# Patient Record
Sex: Female | Born: 1985 | Hispanic: Yes | Marital: Single | Smoking: Never smoker
Health system: Southern US, Community
[De-identification: ages and names within clinical notes are randomized; demographics above are authoritative.]

## PROBLEM LIST (undated history)

## (undated) DIAGNOSIS — Z973 Presence of spectacles and contact lenses: Secondary | ICD-10-CM

## (undated) DIAGNOSIS — B009 Herpesviral infection, unspecified: Secondary | ICD-10-CM

## (undated) DIAGNOSIS — Z8619 Personal history of other infectious and parasitic diseases: Secondary | ICD-10-CM

## (undated) DIAGNOSIS — K2970 Gastritis, unspecified, without bleeding: Secondary | ICD-10-CM

## (undated) DIAGNOSIS — I10 Essential (primary) hypertension: Secondary | ICD-10-CM

## (undated) HISTORY — DX: Herpesviral infection, unspecified: B00.9

## (undated) HISTORY — DX: Personal history of other infectious and parasitic diseases: Z86.19

## (undated) HISTORY — DX: Gastritis, unspecified, without bleeding: K29.70

## (undated) HISTORY — DX: Essential (primary) hypertension: I10

---
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
## 2006-10-30 ENCOUNTER — Ambulatory Visit: Payer: Self-pay | Admitting: Internal Medicine

## 2007-02-26 ENCOUNTER — Ambulatory Visit: Payer: Self-pay | Admitting: Radiology

## 2007-02-26 ENCOUNTER — Ambulatory Visit: Payer: Self-pay | Admitting: Urology

## 2008-12-11 ENCOUNTER — Emergency Department: Payer: Self-pay | Admitting: Internal Medicine

## 2009-12-22 HISTORY — PX: ESOPHAGOGASTRODUODENOSCOPY: SHX1529

## 2010-01-03 ENCOUNTER — Ambulatory Visit: Payer: Self-pay | Admitting: Gastroenterology

## 2010-01-21 ENCOUNTER — Ambulatory Visit: Payer: Self-pay | Admitting: Family Medicine

## 2010-06-27 IMAGING — CR RIGHT LITTLE FINGER 2+V
1 series · 3 of 3 positions shown · non-contrast
Comparison: none

REASON FOR EXAM: injury  swelling
COMMENTS:

PROCEDURE:     DXR - DXR FINGER PINKY 5TH DIGIT RT HA  - December 11, 2008  [DATE]
RESULT:     Images of the right fifth digit demonstrate no definite
fracture, dislocation or radiopaque foreign body.

[Series 1: view not recorded · 0.17mm/px · 3 of 3 slices shown]
[im 1/3]
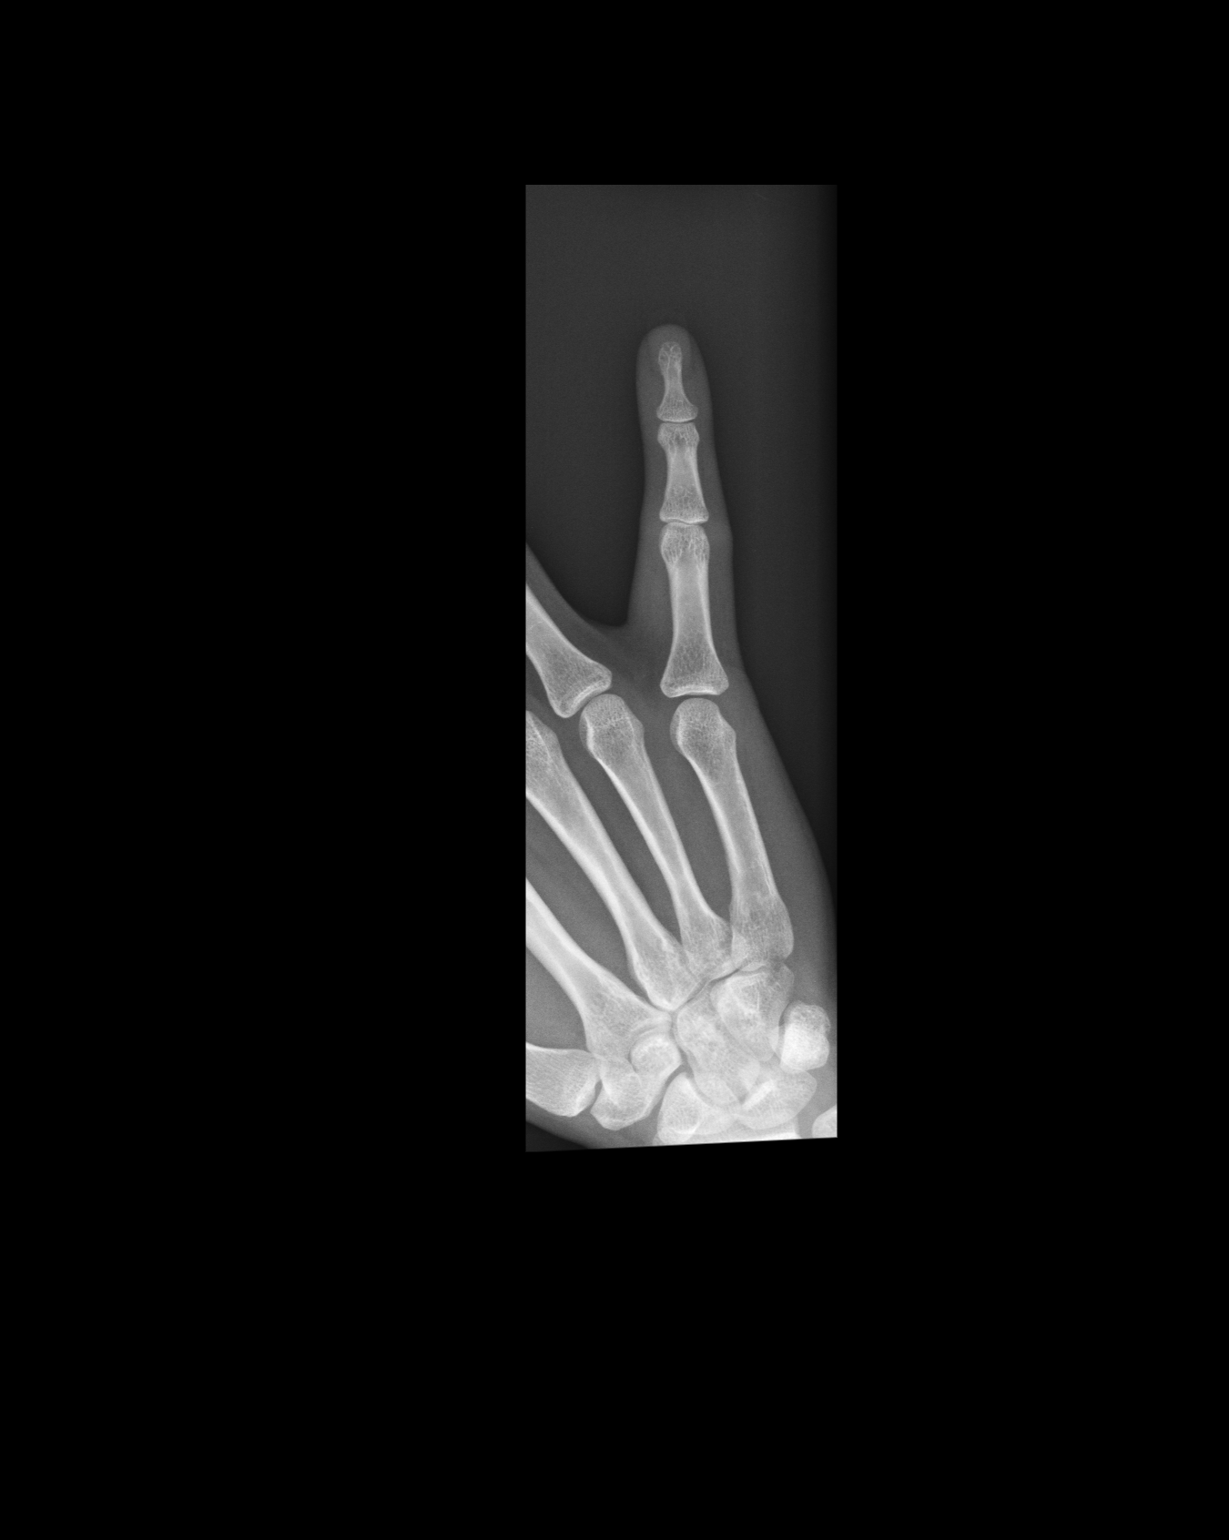
[im 2/3]
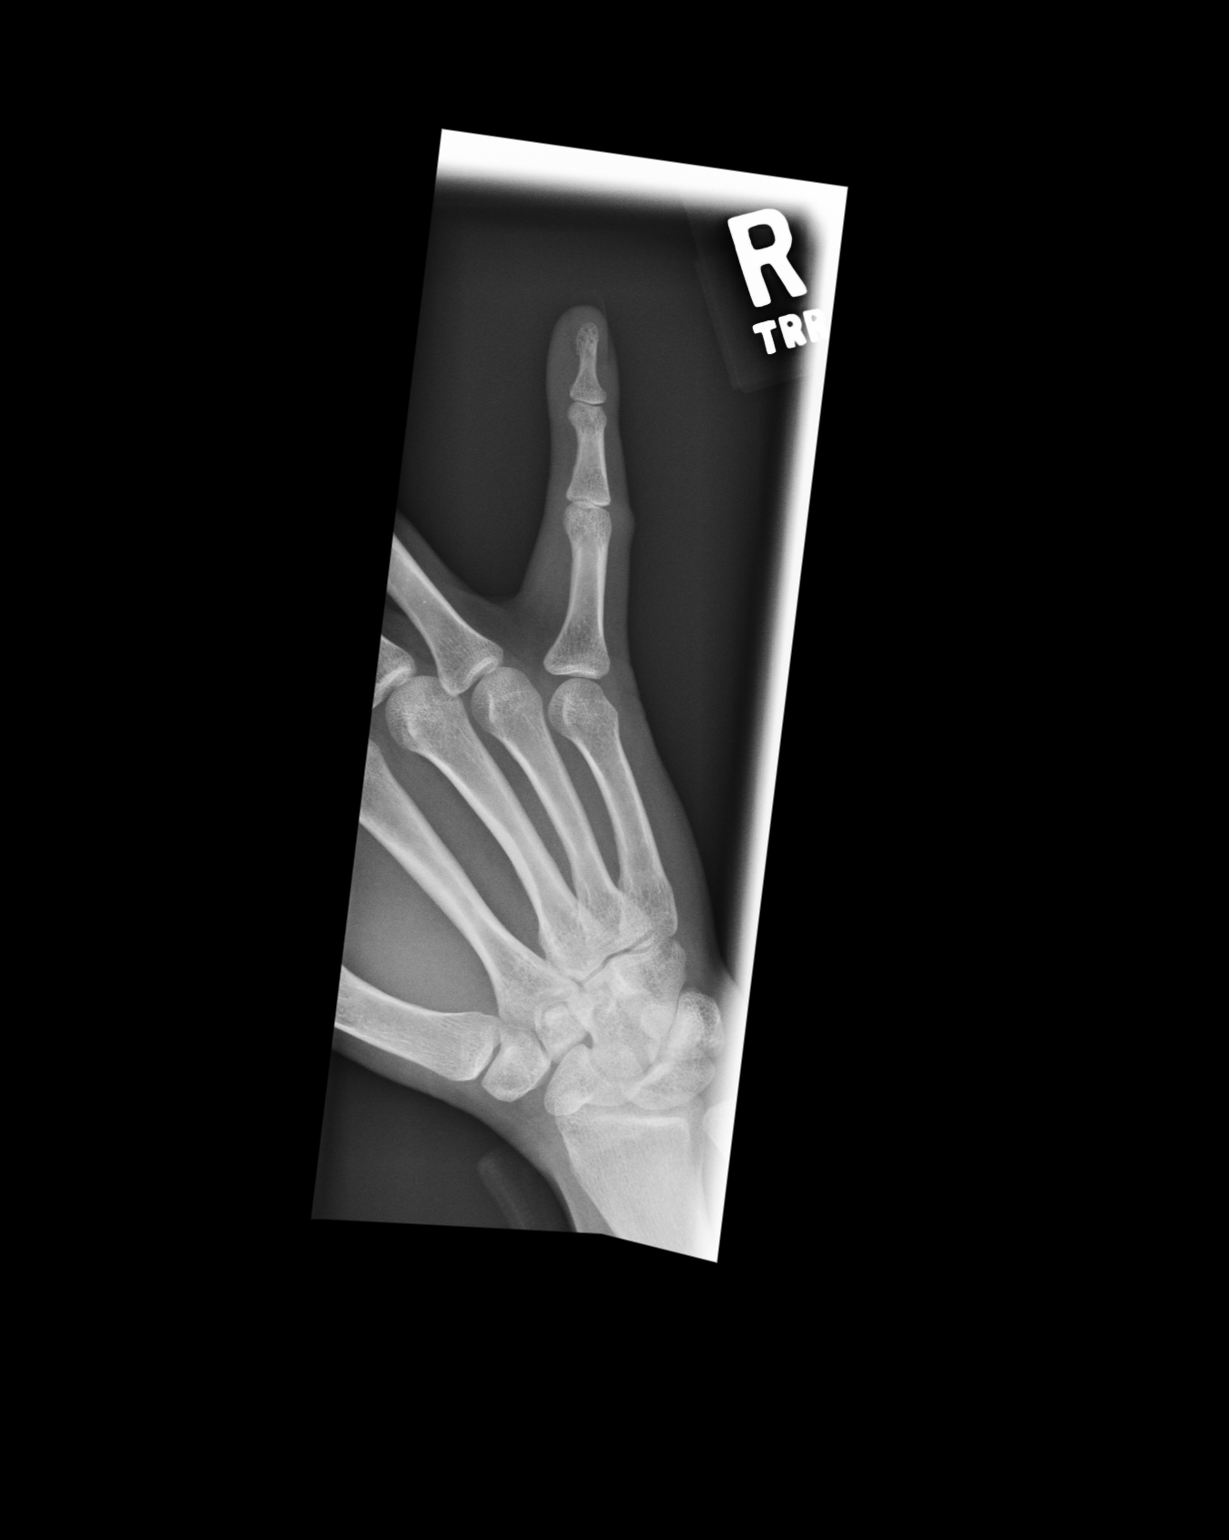
[im 3/3]
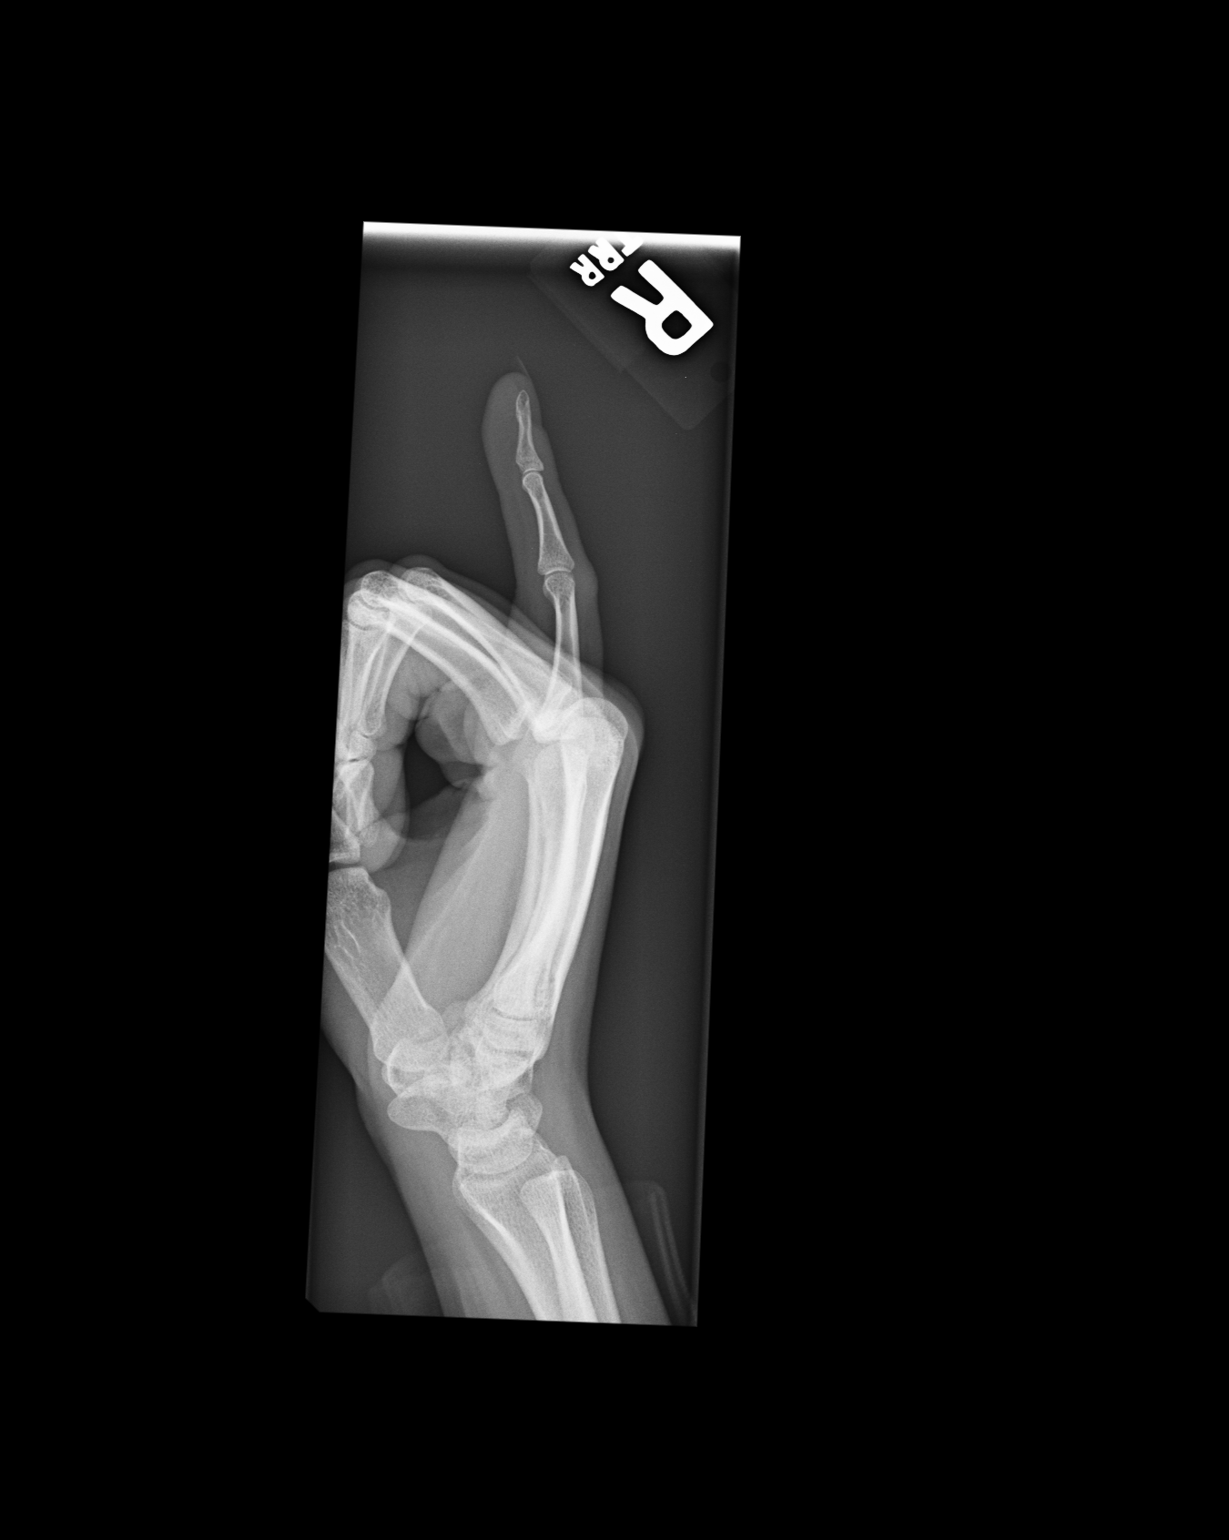

[3 of 3 positions shown; findings below may reference images not displayed]

IMPRESSION: Please see above.

## 2011-05-02 ENCOUNTER — Observation Stay: Payer: Self-pay | Admitting: Obstetrics and Gynecology

## 2011-05-12 ENCOUNTER — Observation Stay: Payer: Self-pay | Admitting: Internal Medicine

## 2011-05-16 ENCOUNTER — Observation Stay: Payer: Self-pay | Admitting: Obstetrics & Gynecology

## 2011-05-19 ENCOUNTER — Observation Stay: Payer: Self-pay | Admitting: Internal Medicine

## 2011-05-21 ENCOUNTER — Observation Stay: Payer: Self-pay

## 2011-05-24 ENCOUNTER — Observation Stay: Payer: Self-pay

## 2011-05-27 ENCOUNTER — Observation Stay: Payer: Self-pay

## 2011-05-30 ENCOUNTER — Observation Stay: Payer: Self-pay | Admitting: Obstetrics and Gynecology

## 2011-06-08 ENCOUNTER — Inpatient Hospital Stay: Payer: Self-pay

## 2011-08-07 IMAGING — CT CT STONE STUDY
1 of 2 series · 15 of 32 positions shown, 19 images · non-contrast
Comparison: none

REASON FOR EXAM: epigastric abd pain  flank pain  hematuria  eval stones
COMMENTS:

[Series 2: soft tissue · axial · 0.58mm/px · z∈[-890,-524]mm · 15 of 139 slices shown, 19 images]
[im 11/139  soft-tissue]
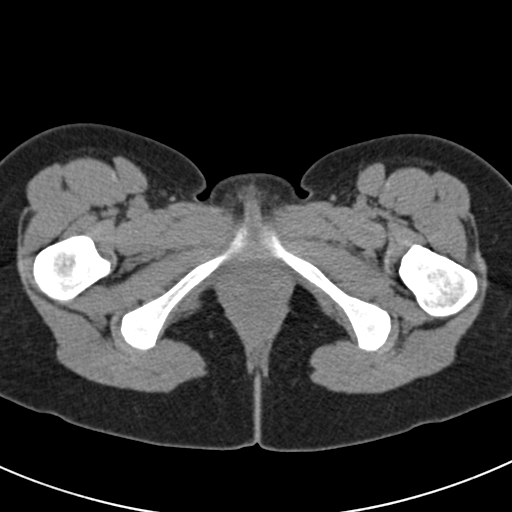
[im 11/139  bone]
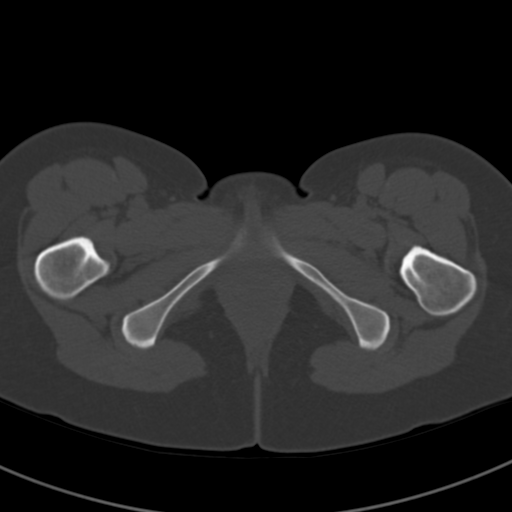
[im 21/139  soft-tissue]
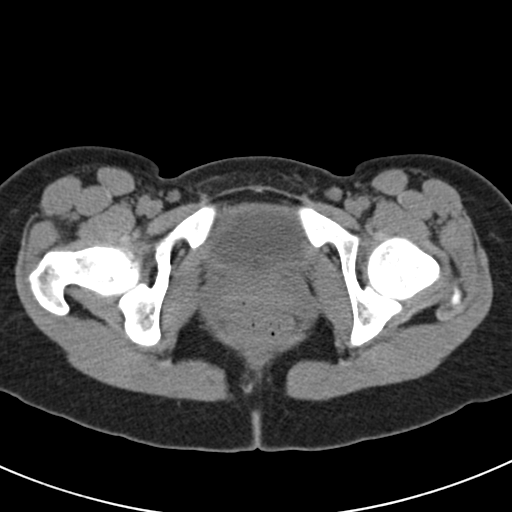
[im 31/139  soft-tissue]
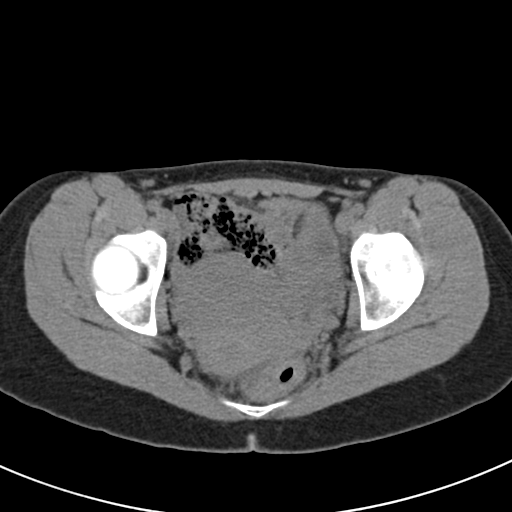
[im 41/139  soft-tissue]
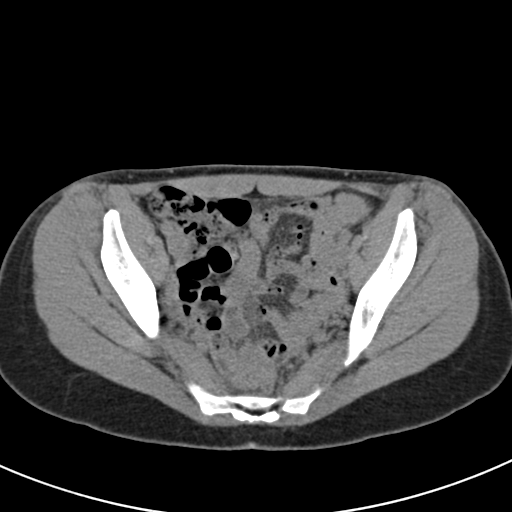
[im 52/139  soft-tissue]
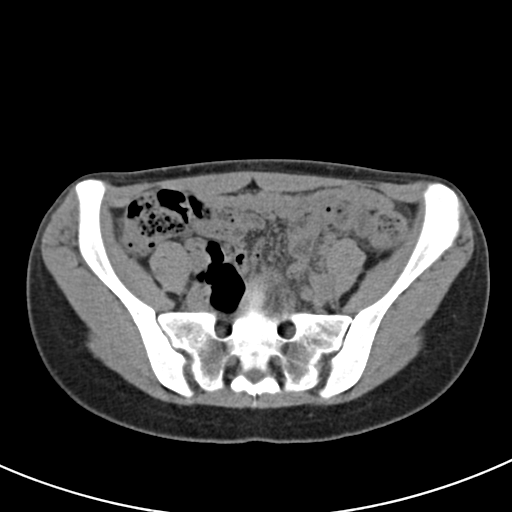
[im 62/139  soft-tissue]
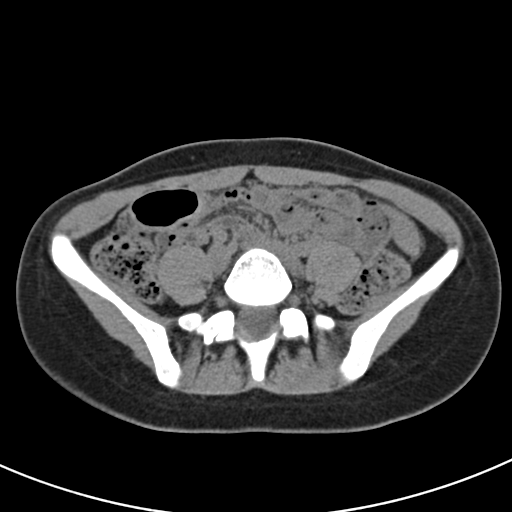
[im 72/139  soft-tissue]
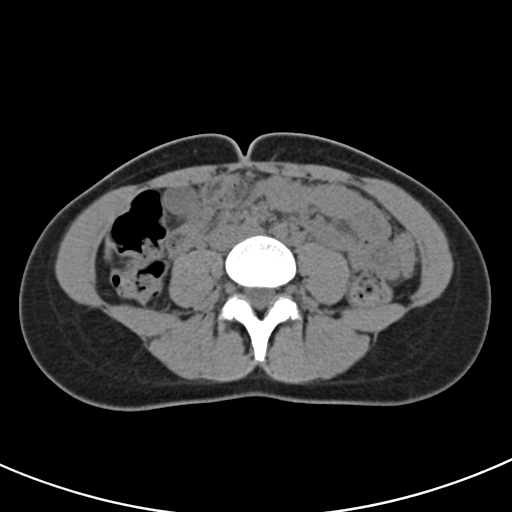
[im 82/139  soft-tissue]
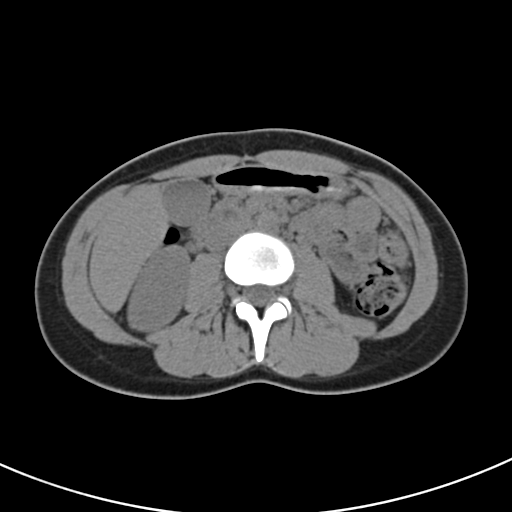
[im 93/139  soft-tissue]
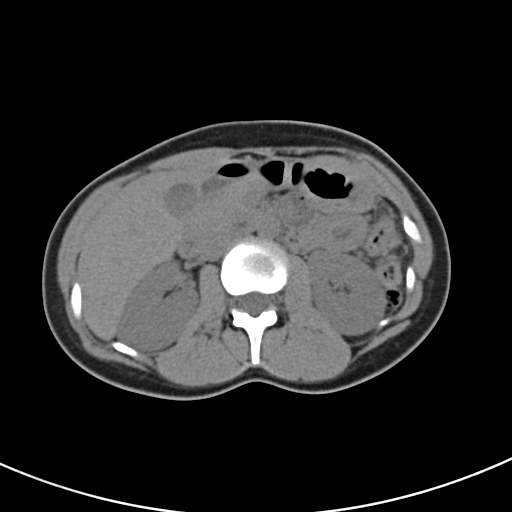
[im 93/139  bone]
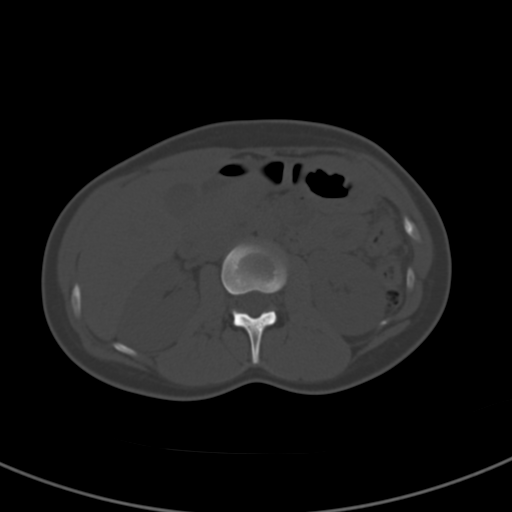
[im 103/139  soft-tissue]
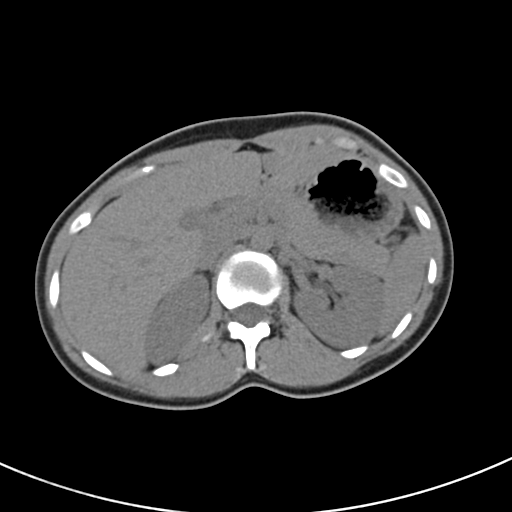
[im 113/139  soft-tissue]
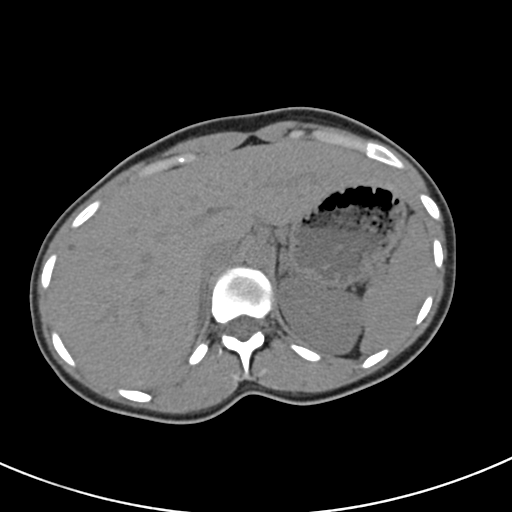
[im 118/139  lung]
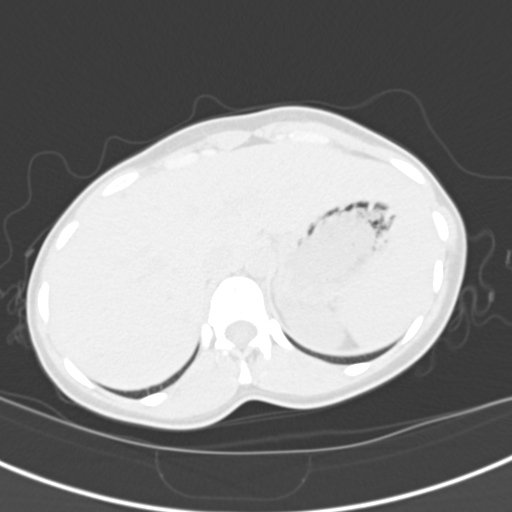
[im 123/139  soft-tissue]
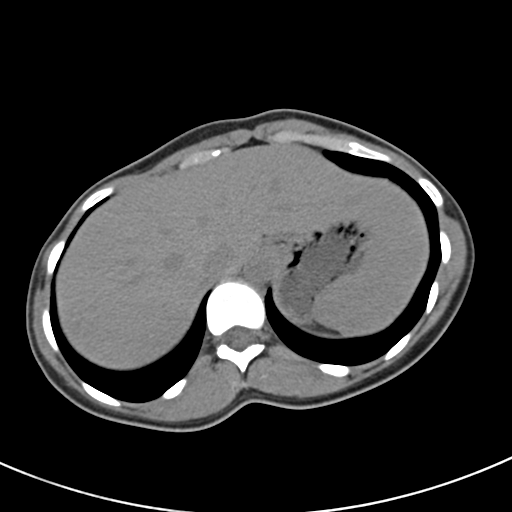
[im 123/139  lung]
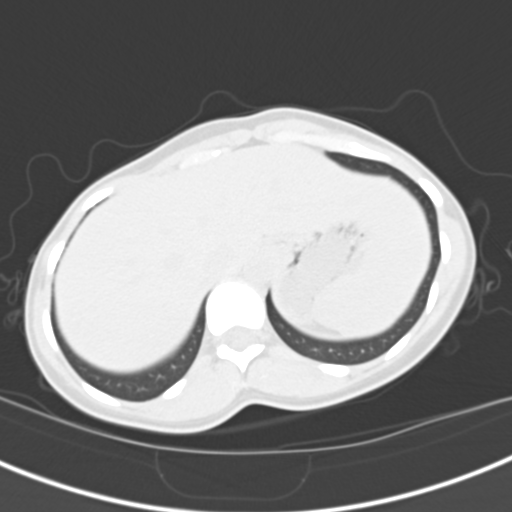
[im 128/139  lung]
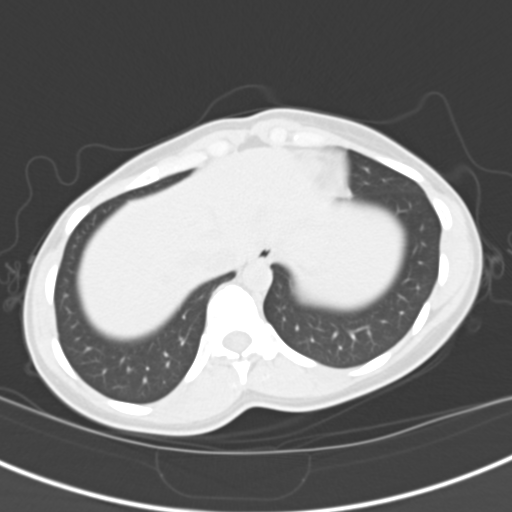
[im 133/139  soft-tissue]
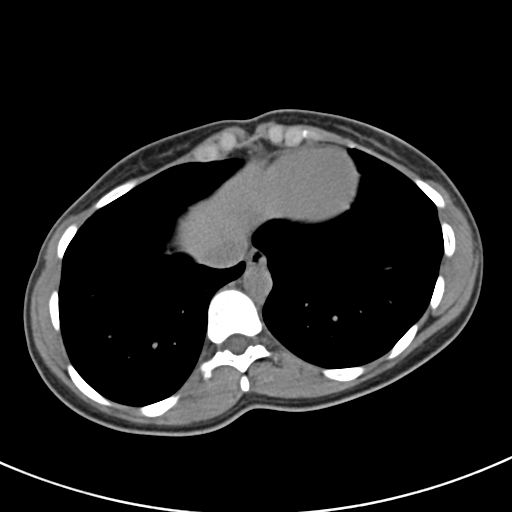
[im 133/139  lung]
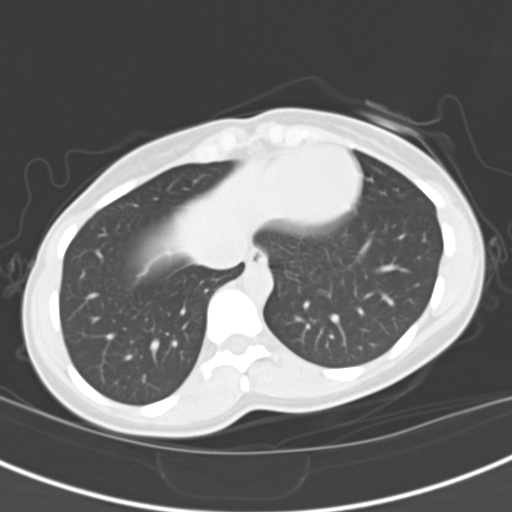

[15 of 32 positions shown; findings below may reference images not displayed]

PROCEDURE:     CT  - CT ABDOMEN /PELVIS WO (STONE)  - January 21, 2010  [DATE]

RESULT:     Axial noncontrast CT scanning was performed through the abdomen
and pelvis at 3 mm intervals and slice thicknesses. Comparison is made to
the study 26 February, 2007.

The kidneys are normal in density and contour. No calcified stones are
identified. The perinephric fat appears normal. Along the expected course of
the ureters I do not see abnormal calcifications. Within the pelvis the
partially distended urinary bladder is normal in appearance. The uterus is
situated to the right of midline. There may be a trace of free fluid in the
pelvis. I do not see suspicious appearing adnexal masses. The unopacified
loops of small and large bowel exhibit no acute abnormality. I do not see
evidence of diverticulosis.

The liver, spleen, partially distended stomach, gallbladder, pancreas, and
adrenal glands are normal in appearance. The caliber of the abdominal aorta
is normal. The lung bases are clear. The lumbar vertebral bodies are
preserved in height. There is gentle curvature of the thoracolumbar spine
with the convexity toward the right.
IMPRESSION: 1. I do not see evidence of calcified urinary tract stones nor other acute
urinary tract abnormality.
2. I do not see evidence of gallstones or other hepatobiliary abnormality.
3. I see no acute bowel abnormality.
4. Within the pelvis there is a small amount of free fluid in the
cul-de-sac. This is not new. I do not see discrete adnexal masses nor acute
uterine pathology.

This study overall is limited due to the lack of oral or intravenous
contrast material. If the patient's symptoms persist and remain unexplained,
contrast enhanced CT scanning of the abdomen and pelvis may be useful. If
the symptoms are referable to the pelvis, a pelvic ultrasound may be a
useful next step.

## 2016-01-02 ENCOUNTER — Ambulatory Visit: Payer: Self-pay | Admitting: Gastroenterology

## 2016-01-03 ENCOUNTER — Encounter: Payer: Self-pay | Admitting: Gastroenterology

## 2016-01-03 ENCOUNTER — Ambulatory Visit (INDEPENDENT_AMBULATORY_CARE_PROVIDER_SITE_OTHER): Payer: BLUE CROSS/BLUE SHIELD | Admitting: Gastroenterology

## 2016-01-03 ENCOUNTER — Ambulatory Visit: Payer: Self-pay | Admitting: Gastroenterology

## 2016-01-03 VITALS — BP 136/72 | HR 80 | Temp 98.9°F | Ht 63.0 in | Wt 148.0 lb

## 2016-01-03 DIAGNOSIS — A048 Other specified bacterial intestinal infections: Secondary | ICD-10-CM

## 2016-01-03 DIAGNOSIS — B9681 Helicobacter pylori [H. pylori] as the cause of diseases classified elsewhere: Secondary | ICD-10-CM

## 2016-01-03 DIAGNOSIS — R109 Unspecified abdominal pain: Secondary | ICD-10-CM

## 2016-01-03 NOTE — Progress Notes (Signed)
Gastroenterology Consultation  Referring Provider:     No ref. provider found Primary Care Physician:  No PCP Per Patient Primary Gastroenterologist:  Dr. Servando Snare     Reason for Consultation:     Left-sided pain.        HPI:   Brooke Koch is a 30 y.o. y/o female referred for consultation & management of left sided pain by Dr. Bonnetta Barry PCP Per Patient.  This patient comes in today after having a history of H. pylori and being seen by  another gastrologist with an upper endoscopy back in 2011. At that time she was found to have gastritis and was treated for H. pylori. The patient denies ever coming back to have her H. pylori occasion checked. The patient states that her abdominal pain went away after that. The pain is not associated with eating or drinking. She also denies any association with moving her bowels. There is no report of any movements making her symptoms worse and the pain does not wake her up from a sleep. The patient has not had any unexplained weight loss or change in bowel habits. The patient reports that the pain is in the left side closer to the back/flank. She is presently not taking any medication for gastritis or dyspepsia. 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  Past Medical History  Diagnosis Date  . History of Helicobacter pylori infection   . Gastritis     Past Surgical History  Procedure Laterality Date  . Esophagogastroduodenoscopy  12/2009    Prior to Admission medications   Not on File    Family History  Problem Relation Age of Onset  . Hypertension Mother   . Hypertension Father   . Diabetes Father   . Glaucoma Mother   . Prostate cancer Father     cancer free x 1 year     Social History  Substance Use Topics  . Smoking status: Never Smoker   . Smokeless tobacco: Never Used  . Alcohol Use: 0.0 oz/week    0 Standard drinks or equivalent per week     Comment: social drinker    Allergies as of  01/03/2016 - Review Complete 01/03/2016  Allergen Reaction Noted  . Septra [sulfamethoxazole-trimethoprim] Hives 01/03/2016    Review of Systems:    All systems reviewed and negative except where noted in HPI.   Physical Exam:  BP 136/72 mmHg  Pulse 80  Temp(Src) 98.9 F (37.2 C) (Oral)  Ht 5\' 3"  (1.6 m)  Wt 148 lb (67.132 kg)  BMI 26.22 kg/m2 No LMP recorded. Psych:  Alert and cooperative. Normal mood and affect. General:   Alert,  Well-developed, well-nourished, pleasant and cooperative in NAD Head:  Normocephalic and atraumatic. Eyes:  Sclera clear, no icterus.   Conjunctiva pink. Ears:  Normal auditory acuity. Nose:  No deformity, discharge, or lesions. Mouth:  No deformity or lesions,oropharynx pink & moist. Neck:  Supple; no masses or thyromegaly. Lungs:  Respirations even and unlabored.  Clear throughout to auscultation.   No wheezes, crackles, or rhonchi. No acute distress. Heart:  Regular rate and rhythm; no murmurs, clicks, rubs, or gallops. Abdomen:  Normal bowel sounds.  No bruits.  Soft, non-tender and non-distended without masses, hepatosplenomegaly or hernias noted.  No guarding or rebound tenderness.  Negative Carnett sign.   Rectal:  Deferred.  Msk:  Symmetrical without gross deformities.  Good, equal movement & strength bilaterally. Pulses:  Normal pulses  noted. Extremities:  No clubbing or edema.  No cyanosis. Neurologic:  Alert and oriented x3;  grossly normal neurologically. Skin:  Intact without significant lesions or rashes.  No jaundice. Lymph Nodes:  No significant cervical adenopathy. Psych:  Alert and cooperative. Normal mood and affect.  Imaging Studies: No results found.  Assessment and Plan:   Brooke Koch is a 30 y.o. y/o female who has a history of H. pylori with eradication ever being checked. The patient has left-sided flank pain that is similar to the pain she had when she was found to have gastritis. The patient will be checked for H.  pylori and will also be started on Dexilant. The symptoms do not appear to be GI related but since she has had this in the past with similar pain and found to have gastritis I will also treat her for the possibility of having gastritis with a PPI. He should has been explained the plan and agrees with it.   Note: This dictation was prepared with Dragon dictation along with smaller phrase technology. Any transcriptional errors that result from this process are unintentional.

## 2016-01-05 LAB — H. PYLORI ANTIBODY, IGA

## 2016-01-08 NOTE — Progress Notes (Signed)
LVM with normal results. Advised to contact me if she has any questions.

## 2016-01-14 ENCOUNTER — Ambulatory Visit: Payer: Self-pay | Admitting: Gastroenterology

## 2016-04-26 ENCOUNTER — Encounter: Payer: Self-pay | Admitting: Gynecology

## 2016-04-26 ENCOUNTER — Ambulatory Visit
Admission: EM | Admit: 2016-04-26 | Discharge: 2016-04-26 | Disposition: A | Discharge status: Home / Self Care | Payer: BLUE CROSS/BLUE SHIELD | Attending: Emergency Medicine | Admitting: Emergency Medicine

## 2016-04-26 DIAGNOSIS — J029 Acute pharyngitis, unspecified: Secondary | ICD-10-CM

## 2016-04-26 LAB — RAPID STREP SCREEN (MED CTR MEBANE ONLY): Streptococcus, Group A Screen (Direct): NEGATIVE

## 2016-04-26 LAB — RAPID INFLUENZA A&B ANTIGENS: Influenza B (ARMC): NEGATIVE

## 2016-04-26 LAB — RAPID INFLUENZA A&B ANTIGENS (ARMC ONLY): INFLUENZA A (ARMC): NEGATIVE

## 2016-04-26 NOTE — Discharge Instructions (Signed)
Pharyngitis Pharyngitis is redness, pain, and swelling (inflammation) of your pharynx.  CAUSES  Pharyngitis is usually caused by infection. Most of the time, these infections are from viruses (viral) and are part of a cold. However, sometimes pharyngitis is caused by bacteria (bacterial). Pharyngitis can also be caused by allergies. Viral pharyngitis may be spread from person to person by coughing, sneezing, and personal items or utensils (cups, forks, spoons, toothbrushes). Bacterial pharyngitis may be spread from person to person by more intimate contact, such as kissing.  SIGNS AND SYMPTOMS  Symptoms of pharyngitis include:   Sore throat.   Tiredness (fatigue).   Low-grade fever.   Headache.  Joint pain and muscle aches.  Skin rashes.  Swollen lymph nodes.  Plaque-like film on throat or tonsils (often seen with bacterial pharyngitis). DIAGNOSIS  Your health care provider will ask you questions about your illness and your symptoms. Your medical history, along with a physical exam, is often all that is needed to diagnose pharyngitis. Sometimes, a rapid strep test is done. Other lab tests may also be done, depending on the suspected cause.  TREATMENT  Viral pharyngitis will usually get better in 3-4 days without the use of medicine. Bacterial pharyngitis is treated with medicines that kill germs (antibiotics).  HOME CARE INSTRUCTIONS   Drink enough water and fluids to keep your urine clear or pale yellow.   Only take over-the-counter or prescription medicines as directed by your health care provider:   If you are prescribed antibiotics, make sure you finish them even if you start to feel better.   Do not take aspirin.   Get lots of rest.   Gargle with 8 oz of salt water ( tsp of salt per 1 qt of water) as often as every 1-2 hours to soothe your throat.   Throat lozenges (if you are not at risk for choking) or sprays may be used to soothe your throat. SEEK MEDICAL  CARE IF:   You have large, tender lumps in your neck.  You have a rash.  You cough up green, yellow-brown, or bloody spit. SEEK IMMEDIATE MEDICAL CARE IF:   Your neck becomes stiff.  You drool or are unable to swallow liquids.  You vomit or are unable to keep medicines or liquids down.  You have severe pain that does not go away with the use of recommended medicines.  You have trouble breathing (not caused by a stuffy nose). MAKE SURE YOU:   Understand these instructions.  Will watch your condition.  Will get help right away if you are not doing well or get worse.   This information is not intended to replace advice given to you by your health care provider. Make sure you discuss any questions you have with your health care provider.   Document Released: 12/08/2005 Document Revised: 09/28/2013 Document Reviewed: 08/15/2013 Elsevier Interactive Patient Education 2016 Elsevier Inc[employer]  Sore Throat A sore throat is pain, burning, irritation, or scratchiness of the throat. There is often pain or tenderness when swallowing or talking. A sore throat may be accompanied by other symptoms, such as coughing, sneezing, fever, and swollen neck glands. A sore throat is often the first sign of another sickness, such as a cold, flu, strep throat, or mononucleosis (commonly known as mono). Most sore throats go away without medical treatment. CAUSES  The most common causes of a sore throat include:  A viral infection, such as a cold, flu, or mono.  A bacterial infection, such as strep throat,  tonsillitis, or whooping cough. °· Seasonal allergies. °· Dryness in the air. °· Irritants, such as smoke or pollution. °· Gastroesophageal reflux disease (GERD). °HOME CARE INSTRUCTIONS  °· Only take over-the-counter medicines as directed by your caregiver. °· Drink enough fluids to keep your urine clear or pale yellow. °· Rest as needed. °· Try using throat sprays, lozenges, or sucking on hard candy to ease  any pain (if older than 4 years or as directed). °· Sip warm liquids, such as broth, herbal tea, or warm water with honey to relieve pain temporarily. You may also eat or drink cold or frozen liquids such as frozen ice pops. °· Gargle with salt water (mix 1 tsp salt with 8 oz of water). °· Do not smoke and avoid secondhand smoke. °· Put a cool-mist humidifier in your bedroom at night to moisten the air. You can also turn on a hot shower and sit in the bathroom with the door closed for 5-10 minutes. °SEEK IMMEDIATE MEDICAL CARE IF: °· You have difficulty breathing. °· You are unable to swallow fluids, soft foods, or your saliva. °· You have increased swelling in the throat. °· Your sore throat does not get better in 7 days. °· You have nausea and vomiting. °· You have a fever or persistent symptoms for more than 2-3 days. °· You have a fever and your symptoms suddenly get worse. °MAKE SURE YOU:  °· Understand these instructions. °· Will watch your condition. °· Will get help right away if you are not doing well or get worse. °  °This information is not intended to replace advice given to you by your health care provider. Make sure you discuss any questions you have with your health care provider. °  °Document Released: 01/15/2005 Document Revised: 12/29/2014 Document Reviewed: 08/15/2012 °Elsevier Interactive Patient Education ©2016 Elsevier Inc. ° °

## 2016-04-26 NOTE — ED Provider Notes (Signed)
CSN: [id number]     Arrival date & time 04/26/16  1456 History   First MD Initiated Contact with Patient 04/26/16 1638     Chief Complaint  Patient presents with  . Sore Throat  . Generalized Body Aches  . Headache   (Consider location/radiation/quality/duration/timing/severity/associated sxs/prior Treatment) HPI   This a 30 year old female who presents with a history of sore throat body aches chills and headache. 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 States that she felt worse this morning and her sore throat was more severe than it was yesterday. No fever or chills she does have some body aches. She had a flu shot in October. Temperature today is 98.8, pulse 92, respirations 16, blood pressure 133/83, and O2 sat on room air 90%. He states that she is not coughing. He has a slight amount of nausea but has had no vomiting or diarrhea     Past Medical History  Diagnosis Date  . History of Helicobacter pylori infection   . Gastritis    Past Surgical History  Procedure Laterality Date  . Esophagogastroduodenoscopy  12/2009   Family History  Problem Relation Age of Onset  . Hypertension Mother   . Hypertension Father   . Diabetes Father   . Glaucoma Mother   . Prostate cancer Father     cancer free x 1 year   Social History  Substance Use Topics  . Smoking status: Never Smoker   . Smokeless tobacco: Never Used  . Alcohol Use: 0.0 oz/week    0 Standard drinks or equivalent per week     Comment: social drinker   OB History    No data available     Review of Systems  Constitutional: Positive for chills, activity change and fatigue. Negative for fever and appetite change.  HENT: Positive for congestion and sore throat.   Respiratory: Negative for cough and shortness of breath.   All other systems reviewed and are negative.   Allergies  Septra  Home Medications   Prior to Admission medications   Medication Sig Start Date End Date Taking? Authorizing  Provider  levonorgestrel (MIRENA) 20 MCG/24HR IUD 1 each by Intrauterine route once.   Yes Historical Provider, MD  Vitamin D, Ergocalciferol, (DRISDOL) 50000 units CAPS capsule Take 50,000 Units by mouth every 7 (seven) days.   Yes Historical Provider, MD   Meds Ordered and Administered this Visit  Medications - No data to display  BP 133/83 mmHg  Pulse 92  Temp(Src) 98.8 F (37.1 C) (Oral)  Resp 16  Ht 5\' 3"  (1.6 m)  Wt 145 lb (65.772 kg)  BMI 25.69 kg/m2  SpO2 98% No data found.   Physical Exam  Constitutional: She is oriented to person, place, and time. She appears well-developed and well-nourished. No distress.  HENT:  Head: Normocephalic and atraumatic.  Right Ear: External ear normal.  Left Ear: External ear normal.  Nose: Nose normal.  Mouth/Throat: Oropharynx is clear and moist. No oropharyngeal exudate.  Eyes: Conjunctivae are normal. Pupils are equal, round, and reactive to light.  Neck: Normal range of motion. Neck supple.  Pulmonary/Chest: Effort normal and breath sounds normal. No respiratory distress. She has no wheezes. She has no rales.  Musculoskeletal: Normal range of motion. She exhibits no edema or tenderness.  Lymphadenopathy:    She has no cervical adenopathy.  Neurological: She is alert and oriented to person, place, and time.  Skin: Skin is warm and dry. She is not diaphoretic.  Psychiatric: She has a normal mood and affect. Her behavior is normal. Judgment and thought content normal.  Nursing note and vitals reviewed.   ED Course  Procedures (including critical care time)  Labs Review Labs Reviewed  RAPID INFLUENZA A&B ANTIGENS ([facility] ONLY)  RAPID STREP SCREEN (NOT AT [facility])  CULTURE, GROUP A STREP [facility])    Imaging Review No results found.   Visual Acuity Review  Right Eye Distance:   Left Eye Distance:   Bilateral Distance:    Right Eye Near:   Left Eye Near:    Bilateral Near:         MDM   1. Pharyngitis    New  Prescriptions   No medications on file  Plan: 1. Test/x-ray results and diagnosis reviewed with patient 2. rx as per orders; risks, benefits, potential side effects reviewed with patient 3. Recommend supportive treatment with fluids and ibuprofen for pain and inflammation. She can use saline gargles ad lib. And also consider Sucrets or other lozenges as necessary. She will call in 48 hours for results of the C and S. They will come back positive for one subgroups then consideration for placed on penicillin will be made at that time. 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 If he is not improving or is worsening she can follow up here again or see her primary care 4. F/u prn if symptoms worsen or don't improve     Lutricia Feil, PA-C 04/26/16 1723

## 2016-04-26 NOTE — ED Notes (Addendum)
Patient c/o x 3 days sore throat / body aches / chills/ and head ace.

## 2016-04-28 LAB — CULTURE, GROUP A STREP (THRC)

## 2017-02-27 NOTE — Progress Notes (Signed)
GYNECOLOGY ANNUAL PHYSICAL EXAM PROGRESS NOTE  Subjective:    Brooke Koch is a 31 y.o. G109P1001 female who presents to establish care, and for an annual exam. She is transferring her care from [facility]. The patient has no complaints today. The patient is sexually active.  The patient wears seatbelts: yes. The patient participates in regular exercise: yes. Has the patient ever been transfused or tattooed?: no. The patient reports that there is not domestic violence in her life.    Gynecologic History Menarche age: 17 No LMP recorded. Patient is not currently having periods (Reason: IUD). Contraception: Mirena IUD (placed in August 2017) History of STI's: Denies Last Pap: 2011. Results were: normal.  Denies h/o abnormal pap smears.   Obstetric History   G1   P1   T1   P0   A0   L1    SAB0   TAB0   Ectopic0   Multiple0   Live Births1     # Outcome Date GA Lbr Len/2nd Weight Sex Delivery Anes PTL Lv  1 Term 06/09/11 [redacted]w[redacted]d  5 lb 10 oz (2.551 kg) F Vag-Spont   LIV      Past Medical History:  Diagnosis Date  . Gastritis   . History of Helicobacter pylori infection     Past Surgical History:  Procedure Laterality Date  . ESOPHAGOGASTRODUODENOSCOPY  12/2009    Family History  Problem Relation Age of Onset  . Hypertension Mother   . Hypertension Father   . Diabetes Father   . Glaucoma Mother   . Prostate cancer Father     cancer free x 1 year    Social History   Social History  . Marital status: Single    Spouse name: N/A  . Number of children: N/A  . Years of education: N/A   Occupational History  . Not on file.   Social History Main Topics  . Smoking status: Never Smoker  . Smokeless tobacco: Never Used  . Alcohol use 0.0 oz/week     Comment: social drinker  . Drug use: No  . Sexual activity: Yes    Birth control/ protection: IUD   Other Topics Concern  . Not on file   Social History Narrative  . No narrative on file    Current Outpatient  Prescriptions on File Prior to Visit  Medication Sig Dispense Refill  . levonorgestrel (MIRENA) 20 MCG/24HR IUD 1 each by Intrauterine route once.    . Vitamin D, Ergocalciferol, (DRISDOL) 50000 units CAPS capsule Take 50,000 Units by mouth every 7 (seven) days.     No current facility-administered medications on file prior to visit.     Allergies  Allergen Reactions  . Septra [Sulfamethoxazole-Trimethoprim] Hives      Review of Systems Constitutional: negative for chills, fatigue, fevers and sweats Eyes: negative for irritation, redness and visual disturbance Ears, nose, mouth, throat, and face: negative for hearing loss, nasal congestion, snoring and tinnitus Respiratory: negative for asthma, cough, sputum Cardiovascular: negative for chest pain, dyspnea, exertional chest pressure/discomfort, irregular heart beat, palpitations and syncope Gastrointestinal: negative for abdominal pain, change in bowel habits, nausea and vomiting Genitourinary: negative for abnormal menstrual periods, genital lesions, sexual problems and vaginal discharge, dysuria and urinary incontinence Integument/breast: negative for breast lump, breast tenderness and nipple discharge Hematologic/lymphatic: negative for bleeding and easy bruising Musculoskeletal:negative for back pain and muscle weakness Neurological: negative for dizziness, headaches, vertigo and weakness Endocrine: negative for diabetic symptoms including polydipsia, polyuria and skin dryness  Allergic/Immunologic: negative for hay fever and urticaria        Objective:  Blood pressure 139/78, pulse 87, height 5\' 2"  (1.575 m), weight 139 lb 8 oz (63.3 kg). Body mass index is 25.51 kg/m.  General Appearance:    Alert, cooperative, no distress, appears stated age, overweight  Head:    Normocephalic, without obvious abnormality, atraumatic  Eyes:    PERRL, conjunctiva/corneas clear, EOM's intact, both eyes  Ears:    Normal external ear canals,  both ears  Nose:   Nares normal, septum midline, mucosa normal, no drainage or sinus tenderness  Throat:   Lips, mucosa, and tongue normal; teeth and gums normal  Neck:   Supple, symmetrical, trachea midline, no adenopathy; thyroid: no enlargement/tenderness/nodules; no carotid bruit or JVD  Back:     Symmetric, no curvature, ROM normal, no CVA tenderness  Lungs:     Clear to auscultation bilaterally, respirations unlabored  Chest Wall:    No tenderness or deformity   Heart:    Regular rate and rhythm, S1 and S2 normal, no murmur, rub or gallop  Breast Exam:    No tenderness, masses, or nipple abnormality  Abdomen:     Soft, non-tender, bowel sounds active all four quadrants, no masses, no organomegaly.    Genitalia:    Pelvic:external genitalia normal, vagina without lesions, discharge, or tenderness, rectovaginal septum  normal. Cervix normal in appearance, no cervical motion tenderness, no adnexal masses or tenderness.  Uterus normal size, shape, mobile, regular contours, nontender.  Rectal:    Normal external sphincter.  No hemorrhoids appreciated. Internal exam not done.   Extremities:   Extremities normal, atraumatic, no cyanosis or edema  Pulses:   2+ and symmetric all extremities  Skin:   Skin color, texture, turgor normal, no rashes or lesions  Lymph nodes:   Cervical, supraclavicular, and axillary nodes normal  Neurologic:   CNII-XII intact, normal strength, sensation and reflexes throughout   .  Assessment:    Healthy female exam.   Overweight  Plan:     Blood tests: No labs ordered as she has had labs performed by an outside PCP. Breast self exam technique reviewed and patient encouraged to perform self-exam monthly. Contraception: IUD. Discussed healthy lifestyle modifications. Pap smear performed today. Declines STI testing.  Follow up in 1 year for annual exam. .     Hildred LaserAnika Karlea Mckibbin, MD [facility]

## 2017-03-04 ENCOUNTER — Ambulatory Visit (INDEPENDENT_AMBULATORY_CARE_PROVIDER_SITE_OTHER): Payer: BLUE CROSS/BLUE SHIELD | Admitting: Obstetrics and Gynecology

## 2017-03-04 ENCOUNTER — Encounter: Payer: Self-pay | Admitting: Obstetrics and Gynecology

## 2017-03-04 VITALS — BP 139/78 | HR 87 | Ht 62.0 in | Wt 139.5 lb

## 2017-03-04 DIAGNOSIS — E663 Overweight: Secondary | ICD-10-CM | POA: Diagnosis not present

## 2017-03-04 DIAGNOSIS — Z01419 Encounter for gynecological examination (general) (routine) without abnormal findings: Secondary | ICD-10-CM | POA: Diagnosis not present

## 2017-03-04 NOTE — Patient Instructions (Signed)
Health Maintenance, Female Adopting a healthy lifestyle and getting preventive care can go a long way to promote health and wellness. Talk with your health care provider about what schedule of regular examinations is right for you. This is a good chance for you to check in with your provider about disease prevention and staying healthy. In between checkups, there are plenty of things you can do on your own. Experts have done a lot of research about which lifestyle changes and preventive measures are most likely to keep you healthy. Ask your health care provider for more information. Weight and diet Eat a healthy diet  Be sure to include plenty of vegetables, fruits, low-fat dairy products, and lean protein.  Do not eat a lot of foods high in solid fats, added sugars, or salt.  Get regular exercise. This is one of the most important things you can do for your health.  Most adults should exercise for at least 150 minutes each week. The exercise should increase your heart rate and make you sweat (moderate-intensity exercise).  Most adults should also do strengthening exercises at least twice a week. This is in addition to the moderate-intensity exercise. Maintain a healthy weight  Body mass index (BMI) is a measurement that can be used to identify possible weight problems. It estimates body fat based on height and weight. Your health care provider can help determine your BMI and help you achieve or maintain a healthy weight.  For females 76 years of age and older:  A BMI below 18.5 is considered underweight.  A BMI of 18.5 to 24.9 is normal.  A BMI of 25 to 29.9 is considered overweight.  A BMI of 30 and above is considered obese. Watch levels of cholesterol and blood lipids  You should start having your blood tested for lipids and cholesterol at 33 years of age, then have this test every 5 years.  You may need to have your cholesterol levels checked more often if:  Your lipid or  cholesterol levels are high.  You are older than 33 years of age.  You are at high risk for heart disease. Cancer screening Lung Cancer  Lung cancer screening is recommended for adults 64-42 years old who are at high risk for lung cancer because of a history of smoking.  A yearly low-dose CT scan of the lungs is recommended for people who:  Currently smoke.  Have quit within the past 15 years.  Have at least a 30-pack-year history of smoking. A pack year is smoking an average of one pack of cigarettes a day for 1 year.  Yearly screening should continue until it has been 15 years since you quit.  Yearly screening should stop if you develop a health problem that would prevent you from having lung cancer treatment. Breast Cancer  Practice breast self-awareness. This means understanding how your breasts normally appear and feel.  It also means doing regular breast self-exams. Let your health care provider know about any changes, no matter how small.  If you are in your 20s or 30s, you should have a clinical breast exam (CBE) by a health care provider every 1-3 years as part of a regular health exam.  If you are 34 or older, have a CBE every year. Also consider having a breast X-ray (mammogram) every year.  If you have a family history of breast cancer, talk to your health care provider about genetic screening.  If you are at high risk for breast cancer, talk  to your health care provider about having an MRI and a mammogram every year.  Breast cancer gene (BRCA) assessment is recommended for women who have family members with BRCA-related cancers. BRCA-related cancers include:  Breast.  Ovarian.  Tubal.  Peritoneal cancers.  Results of the assessment will determine the need for genetic counseling and BRCA1 and BRCA2 testing. Cervical Cancer  Your health care provider may recommend that you be screened regularly for cancer of the pelvic organs (ovaries, uterus, and vagina).  This screening involves a pelvic examination, including checking for microscopic changes to the surface of your cervix (Pap test). You may be encouraged to have this screening done every 3 years, beginning at age 24.  For women ages 66-65, health care providers may recommend pelvic exams and Pap testing every 3 years, or they may recommend the Pap and pelvic exam, combined with testing for human papilloma virus (HPV), every 5 years. Some types of HPV increase your risk of cervical cancer. Testing for HPV may also be done on women of any age with unclear Pap test results.  Other health care providers may not recommend any screening for nonpregnant women who are considered low risk for pelvic cancer and who do not have symptoms. Ask your health care provider if a screening pelvic exam is right for you.  If you have had past treatment for cervical cancer or a condition that could lead to cancer, you need Pap tests and screening for cancer for at least 20 years after your treatment. If Pap tests have been discontinued, your risk factors (such as having a new sexual partner) need to be reassessed to determine if screening should resume. Some women have medical problems that increase the chance of getting cervical cancer. In these cases, your health care provider may recommend more frequent screening and Pap tests. Colorectal Cancer  This type of cancer can be detected and often prevented.  Routine colorectal cancer screening usually begins at 33 years of age and continues through 33 years of age.  Your health care provider may recommend screening at an earlier age if you have risk factors for colon cancer.  Your health care provider may also recommend using home test kits to check for hidden blood in the stool.  A small camera at the end of a tube can be used to examine your colon directly (sigmoidoscopy or colonoscopy). This is done to check for the earliest forms of colorectal cancer.  Routine  screening usually begins at age 41.  Direct examination of the colon should be repeated every 5-10 years through 33 years of age. However, you may need to be screened more often if early forms of precancerous polyps or small growths are found. Skin Cancer  Check your skin from head to toe regularly.  Tell your health care provider about any new moles or changes in moles, especially if there is a change in a mole's shape or color.  Also tell your health care provider if you have a mole that is larger than the size of a pencil eraser.  Always use sunscreen. Apply sunscreen liberally and repeatedly throughout the day.  Protect yourself by wearing long sleeves, pants, a wide-brimmed hat, and sunglasses whenever you are outside. Heart disease, diabetes, and high blood pressure  High blood pressure causes heart disease and increases the risk of stroke. High blood pressure is more likely to develop in:  People who have blood pressure in the high end of the normal range (130-139/85-89 mm Hg).  People who are overweight or obese.  People who are African American.  If you are 59-24 years of age, have your blood pressure checked every 3-5 years. If you are 34 years of age or older, have your blood pressure checked every year. You should have your blood pressure measured twice-once when you are at a hospital or clinic, and once when you are not at a hospital or clinic. Record the average of the two measurements. To check your blood pressure when you are not at a hospital or clinic, you can use:  An automated blood pressure machine at a pharmacy.  A home blood pressure monitor.  If you are between 29 years and 60 years old, ask your health care provider if you should take aspirin to prevent strokes.  Have regular diabetes screenings. This involves taking a blood sample to check your fasting blood sugar level.  If you are at a normal weight and have a low risk for diabetes, have this test once  every three years after 33 years of age.  If you are overweight and have a high risk for diabetes, consider being tested at a younger age or more often. Preventing infection Hepatitis B  If you have a higher risk for hepatitis B, you should be screened for this virus. You are considered at high risk for hepatitis B if:  You were born in a country where hepatitis B is common. Ask your health care provider which countries are considered high risk.  Your parents were born in a high-risk country, and you have not been immunized against hepatitis B (hepatitis B vaccine).  You have HIV or AIDS.  You use needles to inject street drugs.  You live with someone who has hepatitis B.  You have had sex with someone who has hepatitis B.  You get hemodialysis treatment.  You take certain medicines for conditions, including cancer, organ transplantation, and autoimmune conditions. Hepatitis C  Blood testing is recommended for:  Everyone born from [birth year] through 1967.  Anyone with known risk factors for hepatitis C. Sexually transmitted infections (STIs)  You should be screened for sexually transmitted infections (STIs) including gonorrhea and chlamydia if:  You are sexually active and are younger than 33 years of age.  You are older than 33 years of age and your health care provider tells you that you are at risk for this type of infection.  Your sexual activity has changed since you were last screened and you are at an increased risk for chlamydia or gonorrhea. Ask your health care provider if you are at risk.  If you do not have HIV, but are at risk, it may be recommended that you take a prescription medicine daily to prevent HIV infection. This is called pre-exposure prophylaxis (PrEP). You are considered at risk if:  You are sexually active and do not regularly use condoms or know the HIV status of your partner(s).  You take drugs by injection.  You are sexually active with a partner  who has HIV. Talk with your health care provider about whether you are at high risk of being infected with HIV. If you choose to begin PrEP, you should first be tested for HIV. You should then be tested every 3 months for as long as you are taking PrEP. Pregnancy  If you are premenopausal and you may become pregnant, ask your health care provider about preconception counseling.  If you may become pregnant, take 400 to 800 micrograms (mcg) of folic acid  every day.  If you want to prevent pregnancy, talk to your health care provider about birth control (contraception). Osteoporosis and menopause  Osteoporosis is a disease in which the bones lose minerals and strength with aging. This can result in serious bone fractures. Your risk for osteoporosis can be identified using a bone density scan.  If you are 4 years of age or older, or if you are at risk for osteoporosis and fractures, ask your health care provider if you should be screened.  Ask your health care provider whether you should take a calcium or vitamin D supplement to lower your risk for osteoporosis.  Menopause may have certain physical symptoms and risks.  Hormone replacement therapy may reduce some of these symptoms and risks. Talk to your health care provider about whether hormone replacement therapy is right for you. Follow these instructions at home:  Schedule regular health, dental, and eye exams.  Stay current with your immunizations.  Do not use any tobacco products including cigarettes, chewing tobacco, or electronic cigarettes.  If you are pregnant, do not drink alcohol.  If you are breastfeeding, limit how much and how often you drink alcohol.  Limit alcohol intake to no more than 1 drink per day for nonpregnant women. One drink equals 12 ounces of beer, 5 ounces of wine, or 1 ounces of hard liquor.  Do not use street drugs.  Do not share needles.  Ask your health care provider for help if you need support  or information about quitting drugs.  Tell your health care provider if you often feel depressed.  Tell your health care provider if you have ever been abused or do not feel safe at home. This information is not intended to replace advice given to you by your health care provider. Make sure you discuss any questions you have with your health care provider. Document Released: 06/23/2011 Document Revised: 05/15/2016 Document Reviewed: 09/11/2015 Elsevier Interactive Patient Education  2017 [employer].

## 2017-03-06 LAB — PAP IG AND HPV HIGH-RISK
HPV, HIGH-RISK: NEGATIVE
PAP Smear Comment: 0

## 2017-03-08 DIAGNOSIS — E663 Overweight: Secondary | ICD-10-CM | POA: Insufficient documentation

## 2017-05-12 ENCOUNTER — Ambulatory Visit (INDEPENDENT_AMBULATORY_CARE_PROVIDER_SITE_OTHER): Payer: BLUE CROSS/BLUE SHIELD | Admitting: Obstetrics and Gynecology

## 2017-05-12 ENCOUNTER — Encounter: Payer: Self-pay | Admitting: Obstetrics and Gynecology

## 2017-05-12 VITALS — BP 131/82 | HR 65 | Ht 63.0 in | Wt 136.3 lb

## 2017-05-12 DIAGNOSIS — B9689 Other specified bacterial agents as the cause of diseases classified elsewhere: Secondary | ICD-10-CM

## 2017-05-12 DIAGNOSIS — N760 Acute vaginitis: Secondary | ICD-10-CM

## 2017-05-12 DIAGNOSIS — T8332XA Displacement of intrauterine contraceptive device, initial encounter: Secondary | ICD-10-CM | POA: Diagnosis not present

## 2017-05-12 MED ORDER — FLUCONAZOLE 150 MG PO TABS: 150.0000 mg | ORAL_TABLET | Freq: Once | ORAL | 3 refills | Status: AC

## 2017-05-12 MED ORDER — METRONIDAZOLE 500 MG PO TABS: 500.0000 mg | ORAL_TABLET | Freq: Two times a day (BID) | ORAL | 0 refills | Status: DC

## 2017-05-12 NOTE — Progress Notes (Signed)
    GYNECOLOGY CLINIC PROGRESS NOTE  Subjective:     Brooke Koch is a 31 y.o. female who presents for evaluation of an abnormal vaginal discharge. Symptoms have been present for 1 week. Vaginal symptoms: discharge described as white and thin, local irritation and odor. Contraception: IUD. She denies abnormal bleeding, lesions and post coital bleeding Sexually transmitted infection risk: very low risk of STD exposure. Menstrual flow: regular every 28-30 days.  The following portions of the patient's history were reviewed and updated as appropriate: allergies, current medications, past family history, past medical history, past social history, past surgical history and problem list.   Review of Systems Pertinent items noted in HPI and remainder of comprehensive ROS otherwise negative.    Objective:    BP 131/82 (BP Location: Left Arm, Patient Position: Sitting, Cuff Size: Normal)   Pulse 65   Ht 5\' 3"  (1.6 m)   Wt 136 lb 4.8 oz (61.8 kg)   BMI 24.14 kg/m  General appearance: alert and no distress Abdomen: soft, non-tender; bowel sounds normal; no masses,  no organomegaly Pelvic: external genitalia normal, rectovaginal septum normal.  Vagina without discharge.  Cervix normal appearing, no lesions and no motion tenderness. IUD strings unable to be visualized. The Uterus mobile, nontender, normal shape and size.  Adnexae non-palpable, nontender bilaterally.  Skin: Skin color, texture, turgor normal. No rashes or lesions    Microscopic wet-mount exam shows: numerous clue cells and WBCs, no trichomonads, yeast hyphae or buds  Assessment:    Bacterial vaginosis.   Lost IUD threads  Plan:    Symptomatic local care discussed. Oral antibiotics see orders.   No IUD strings visualized today, could not find using cytobrush.  Will have patient return for ultrasound to confirm IUD location in next several weeks.  Denies any recent pelvic pain or abnormal bleeding.  Likely strings are within  the cervix.  Advised that if patient does a string check and finds strings prior to scheduled ultrasound, can cancel appointment.  Advised to use back up method (barrier method) until IUD location has been confirmed.    Hildred Laserherry, Ardine Iacovelli, MD [facility]

## 2017-05-20 ENCOUNTER — Telehealth: Payer: Self-pay | Admitting: Obstetrics and Gynecology

## 2017-05-20 NOTE — Telephone Encounter (Signed)
Seeing as how she just completed the antibiotics maybe a day ago, she may still have residual symptoms for a few days. I would recommend using Vagisil wipes for any itching/irritation for the next few days.  If still not relieved, I would take a second Diflucan.

## 2017-05-20 NOTE — Telephone Encounter (Signed)
Pt recently treated for BV/Yeast still symptomatic. Please advise.

## 2017-05-20 NOTE — Telephone Encounter (Signed)
Patient has finished her medication and still having itching - not any better  Please call

## 2017-05-20 NOTE — Telephone Encounter (Signed)
Called pt informed her of information below. Pt gave verbal understanding.  

## 2017-06-02 ENCOUNTER — Ambulatory Visit (INDEPENDENT_AMBULATORY_CARE_PROVIDER_SITE_OTHER): Payer: BLUE CROSS/BLUE SHIELD | Admitting: Obstetrics and Gynecology

## 2017-06-02 VITALS — BP 129/81 | HR 60 | Ht 63.0 in | Wt 139.0 lb

## 2017-06-02 DIAGNOSIS — Z30432 Encounter for removal of intrauterine contraceptive device: Secondary | ICD-10-CM | POA: Diagnosis not present

## 2017-06-02 DIAGNOSIS — Z30011 Encounter for initial prescription of contraceptive pills: Secondary | ICD-10-CM

## 2017-06-02 MED ORDER — NORETHIN ACE-ETH ESTRAD-FE 1-20 MG-MCG PO TABS: 1.0000 | ORAL_TABLET | Freq: Every day | ORAL | 11 refills | Status: DC

## 2017-06-02 NOTE — Patient Instructions (Addendum)
Oral Contraception Use Oral contraceptive pills (OCPs) are medicines taken to prevent pregnancy. OCPs work by preventing the ovaries from releasing eggs. The hormones in OCPs also cause the cervical mucus to thicken, preventing the sperm from entering the uterus. The hormones also cause the uterine lining to become thin, not allowing a fertilized egg to attach to the inside of the uterus. OCPs are highly effective when taken exactly as prescribed. However, OCPs do not prevent sexually transmitted diseases (STDs). Safe sex practices, such as using condoms along with an OCP, can help prevent STDs. Before taking OCPs, you may have a physical exam and Pap test. Your health care provider may also order blood tests if necessary. Your health care provider will make sure you are a good candidate for oral contraception. Discuss with your health care provider the possible side effects of the OCP you may be prescribed. When starting an OCP, it can take 2 to 3 months for the body to adjust to the changes in hormone levels in your body. How to take oral contraceptive pills Your health care provider may advise you on how to start taking the first cycle of OCPs. Otherwise, you can:  Start on day 1 of your menstrual period. You will not need any backup contraceptive protection with this start time.  Start on the first Sunday after your menstrual period or the day you get your prescription. In these cases, you will need to use backup contraceptive protection for the first week.  Start the pill at any time of your cycle. If you take the pill within 5 days of the start of your period, you are protected against pregnancy right away. In this case, you will not need a backup form of birth control. If you start at any other time of your menstrual cycle, you will need to use another form of birth control for 7 days. If your OCP is the type called a minipill, it will protect you from pregnancy after taking it for 2 days (48  hours).  After you have started taking OCPs:  If you forget to take 1 pill, take it as soon as you remember. Take the next pill at the regular time.  If you miss 2 or more pills, call your health care provider because different pills have different instructions for missed doses. Use backup birth control until your next menstrual period starts.  If you use a 28-day pack that contains inactive pills and you miss 1 of the last 7 pills (pills with no hormones), it will not matter. Throw away the rest of the non-hormone pills and start a new pill pack.  No matter which day you start the OCP, you will always start a new pack on that same day of the week. Have an extra pack of OCPs and a backup contraceptive method available in case you miss some pills or lose your OCP pack. Follow these instructions at home:  Do not smoke.  Always use a condom to protect against STDs. OCPs do not protect against STDs.  Use a calendar to mark your menstrual period days.  Read the information and directions that came with your OCP. Talk to your health care provider if you have questions. Contact a health care provider if:  You develop nausea and vomiting.  You have abnormal vaginal discharge or bleeding.  You develop a rash.  You miss your menstrual period.  You are losing your hair.  You need treatment for mood swings or depression.  You   get dizzy when taking the OCP.  You develop acne from taking the OCP.  You become pregnant. Get help right away if:  You develop chest pain.  You develop shortness of breath.  You have an uncontrolled or severe headache.  You develop numbness or slurred speech.  You develop visual problems.  You develop pain, redness, and swelling in the legs. This information is not intended to replace advice given to you by your health care provider. Make sure you discuss any questions you have with your health care provider. Document Released: 11/27/2011 Document  Revised: 05/15/2016 Document Reviewed: 05/29/2013 Elsevier Interactive Patient Education  2017 [employer]  

## 2017-06-02 NOTE — Progress Notes (Signed)
    GYNECOLOGY OFFICE PROCEDURE NOTE  Webb LawsCristina Hellwig is a 31 y.o. G1P1001 here for Mirena IUD removal. No GYN concerns.  Last pap smear was on 03/04/2017 and was normal.  IUD Removal  Patient identified, informed consent performed, consent signed.  Patient was in the dorsal lithotomy position, normal external genitalia was noted.  A speculum was placed in the patient's vagina, normal discharge was noted, no lesions. The cervix was visualized, no lesions, no abnormal discharge.  The strings of the IUD were grasped and pulled using ring forceps. The IUD was removed in its entirety.  Patient tolerated the procedure well.    Patient will use combined OCPs for contraception.  Advised to use back up method for first month. Can begin OCPs today.  Routine preventative health maintenance measures emphasized.   Hildred Laserherry, Kinnie Kaupp, MD [facility]

## 2017-06-12 ENCOUNTER — Other Ambulatory Visit: Payer: BLUE CROSS/BLUE SHIELD

## 2017-06-16 ENCOUNTER — Ambulatory Visit (INDEPENDENT_AMBULATORY_CARE_PROVIDER_SITE_OTHER): Payer: BLUE CROSS/BLUE SHIELD | Admitting: Obstetrics and Gynecology

## 2017-06-16 VITALS — BP 126/66 | HR 73 | Ht 63.0 in | Wt 140.5 lb

## 2017-06-16 DIAGNOSIS — R399 Unspecified symptoms and signs involving the genitourinary system: Secondary | ICD-10-CM | POA: Diagnosis not present

## 2017-06-16 DIAGNOSIS — Z87440 Personal history of urinary (tract) infections: Secondary | ICD-10-CM

## 2017-06-16 LAB — POCT URINALYSIS DIPSTICK
Bilirubin, UA: NEGATIVE
GLUCOSE UA: NEGATIVE
Ketones, UA: NEGATIVE
NITRITE UA: POSITIVE
Protein, UA: 100
Spec Grav, UA: 1.02 (ref 1.010–1.025)
UROBILINOGEN UA: 0.2 U/dL
pH, UA: 6 (ref 5.0–8.0)

## 2017-06-16 MED ORDER — NITROFURANTOIN MONOHYD MACRO 100 MG PO CAPS
100.0000 mg | ORAL_CAPSULE | Freq: Two times a day (BID) | ORAL | 1 refills | Status: DC
Start: 2017-06-16 — End: 2018-01-21

## 2017-06-16 NOTE — Progress Notes (Signed)
Subjective:    Brooke Koch is a 31 y.o. female who complains of burning with urination, dysuria, left flank pain and frequency. She has had symptoms for 1 week. Patient denies fever and chills. Patient does have a history of recurrent UTI (however last UTI was UTI). Patient does not have a history of pyelonephritis.  Takes D-Mannose (OTC supplement to help with UTI symptoms).  The following portions of the patient's history were reviewed and updated as appropriate: allergies, current medications, past family history, past medical history, past social history, past surgical history and problem list.  Review of Systems Pertinent items noted in HPI and remainder of comprehensive ROS otherwise negative.    Objective:    BP 126/66 (BP Location: Left Arm, Patient Position: Sitting, Cuff Size: Normal)   Pulse 73   Ht 5\' 3"  (1.6 m)   Wt 140 lb 8 oz (63.7 kg)   BMI 24.89 kg/m  General appearance: alert, cooperative and no distress Back: symmetric, no curvature. ROM normal. No CVA tenderness. Abdomen: soft, non-tender; bowel sounds normal; no masses,  no organomegaly Pelvic: deferred Extremities: extremities normal, atraumatic, no cyanosis or edema  Laboratory:  Results for orders placed or performed in visit on 06/16/17  POCT urinalysis dipstick  Result Value Ref Range   Color, UA dark yellow    Clarity, UA cloudy    Glucose, UA neg    Bilirubin, UA neg    Ketones, UA neg    Spec Grav, UA 1.020 1.010 - 1.025   Blood, UA LARGE    pH, UA 6.0 5.0 - 8.0   Protein, UA 100 ++    Urobilinogen, UA 0.2 0.2 or 1.0 E.U./dL   Nitrite, UA POSITIVE    Leukocytes, UA Large (3+) (A) Negative     Assessment:    Acute cystitis    H/o recurrent UTIs Plan:    Medications: nitrofurantoin.  Will send urine culture.  Maintain adequate hydration. Follow up if symptoms not improving, and as needed.

## 2017-06-16 NOTE — Patient Instructions (Addendum)

## 2017-06-20 LAB — URINE CULTURE

## 2017-07-13 ENCOUNTER — Encounter: Payer: Self-pay | Admitting: Obstetrics and Gynecology

## 2017-07-13 ENCOUNTER — Ambulatory Visit (INDEPENDENT_AMBULATORY_CARE_PROVIDER_SITE_OTHER): Payer: BLUE CROSS/BLUE SHIELD | Admitting: Obstetrics and Gynecology

## 2017-07-13 VITALS — BP 137/86 | HR 66 | Temp 98.5°F | Resp 16 | Wt 141.4 lb

## 2017-07-13 DIAGNOSIS — R102 Pelvic and perineal pain: Secondary | ICD-10-CM

## 2017-07-13 NOTE — Progress Notes (Signed)
HPI:      Ms. Webb LawsCristina Shampine is a 31 y.o. G1P1001 who LMP was Patient's last menstrual period was 06/06/2017.  Subjective:   She presents today Because last week after intercourse she had severe abdominal pain in the midline. This occurred after intercourse. She denies vaginal discharge denies bleeding. She has been on birth control pills for approximately 6 weeks. She says she is taking them correctly. She has not had the pain since that time. She denies urinary symptoms. She does not have any sexual partner.   Hx: The following portions of the patient's history were reviewed and updated as appropriate:             She  has a past medical history of Gastritis and History of Helicobacter pylori infection. She  does not have any pertinent problems on file. She  has a past surgical history that includes Esophagogastroduodenoscopy (12/2009). Her family history includes Diabetes in her father; Glaucoma in her mother; Hyperlipidemia in her mother; Hypertension in her father and mother; Prostate cancer in her father. She  reports that she has never smoked. She has never used smokeless tobacco. She reports that she drinks alcohol. She reports that she does not use drugs. She is allergic to septra [sulfamethoxazole-trimethoprim].       Review of Systems:  Review of Systems  Constitutional: Denied constitutional symptoms, night sweats, recent illness, fatigue, fever, insomnia and weight loss.  Eyes: Denied eye symptoms, eye pain, photophobia, vision change and visual disturbance.  Ears/Nose/Throat/Neck: Denied ear, nose, throat or neck symptoms, hearing loss, nasal discharge, sinus congestion and sore throat.  Cardiovascular: Denied cardiovascular symptoms, arrhythmia, chest pain/pressure, edema, exercise intolerance, orthopnea and palpitations.  Respiratory: Denied pulmonary symptoms, asthma, pleuritic pain, productive sputum, cough, dyspnea and wheezing.  Gastrointestinal: Denied, gastro-esophageal  reflux, melena, nausea and vomiting.  Genitourinary: Pain immediately after intercourse  Musculoskeletal: Denied musculoskeletal symptoms, stiffness, swelling, muscle weakness and myalgia.  Dermatologic: Denied dermatology symptoms, rash and scar.  Neurologic: Denied neurology symptoms, dizziness, headache, neck pain and syncope.  Psychiatric: Denied psychiatric symptoms, anxiety and depression.  Endocrine: Denied endocrine symptoms including hot flashes and night sweats.   Meds:   Current Outpatient Prescriptions on File Prior to Visit  Medication Sig Dispense Refill  . Multiple Vitamin (MULTIVITAMIN) capsule Take 1 capsule by mouth daily.    . norethindrone-ethinyl estradiol (JUNEL FE 1/20) 1-20 MG-MCG tablet Take 1 tablet by mouth daily. 1 Package 11  . nitrofurantoin, macrocrystal-monohydrate, (MACROBID) 100 MG capsule Take 1 capsule (100 mg total) by mouth 2 (two) times daily. 14 capsule 1   No current facility-administered medications on file prior to visit.     Objective:     Vitals:   07/13/17 1119  BP: 137/86  Pulse: 66  Resp: 16  Temp: 98.5 F (36.9 C)              Physical examination   Pelvic:   Vulva: Normal appearance.  No lesions.  Vagina: No lesions or abnormalities noted.  Support: Normal pelvic support.  Urethra No masses tenderness or scarring.  Meatus Normal size without lesions or prolapse.  Cervix: Normal appearance.  No lesions.  Anus: Normal exam.  No lesions.  Perineum: Normal exam.  No lesions.        Bimanual   Uterus: Normal size.  Non-tender.  Mobile.  AV.  Adnexae: No masses.  Non-tender to palpation.  Cul-de-sac: Negative for abnormality.   UA: Negative  Assessment:    G1P1001 Patient Active  Problem List   Diagnosis Date Noted  . History of recurrent UTIs 06/16/2017  . Overweight (BMI 25.0-29.9) 03/08/2017     1. Pelvic pain in female     A one-time episode after intercourse. No pain on exam today.  If I have to offer an  explanation I would consider possible ovarian cyst.  Plan:            1.  Expected management of her pain. If her pain recurs consider pelvic ultrasound.    Orders No orders of the defined types were placed in this encounter.   No orders of the defined types were placed in this encounter.       F/U  Return for Pt to contact us if symptoms worsen.  Elonda Husky, M.D. 07/13/2017 12:11 PM

## 2017-10-12 ENCOUNTER — Encounter: Payer: BLUE CROSS/BLUE SHIELD | Admitting: Certified Nurse Midwife

## 2018-01-21 ENCOUNTER — Encounter: Payer: Self-pay | Admitting: Obstetrics and Gynecology

## 2018-01-21 ENCOUNTER — Ambulatory Visit: Payer: Self-pay | Admitting: Obstetrics and Gynecology

## 2018-01-21 VITALS — BP 131/78 | HR 78 | Wt 146.5 lb

## 2018-01-21 DIAGNOSIS — Z87440 Personal history of urinary (tract) infections: Secondary | ICD-10-CM

## 2018-01-21 DIAGNOSIS — N3001 Acute cystitis with hematuria: Secondary | ICD-10-CM

## 2018-01-21 DIAGNOSIS — R109 Unspecified abdominal pain: Secondary | ICD-10-CM

## 2018-01-21 DIAGNOSIS — R21 Rash and other nonspecific skin eruption: Secondary | ICD-10-CM

## 2018-01-21 LAB — POCT URINALYSIS DIPSTICK
Bilirubin, UA: NEGATIVE
GLUCOSE UA: NEGATIVE
LEUKOCYTES UA: NEGATIVE
NITRITE UA: NEGATIVE
Protein, UA: NEGATIVE
SPEC GRAV UA: 1.01 (ref 1.010–1.025)
Urobilinogen, UA: 0.2 E.U./dL
pH, UA: 6 (ref 5.0–8.0)

## 2018-01-21 MED ORDER — NITROFURANTOIN MONOHYD MACRO 100 MG PO CAPS: 100.0000 mg | ORAL_CAPSULE | Freq: Two times a day (BID) | ORAL | 0 refills | Status: DC

## 2018-01-21 NOTE — Progress Notes (Signed)
    GYNECOLOGY CLINIC PROGRESS NOTE Subjective:    Brooke LawsCristina Koch is a 32 y.o. 731P1001 female who complains of abnormal smelling urine, left flank pain and frequency. She has had symptoms for 2 weeks, on and off.   Patient denies fever and vaginal discharge. Patient does have a history of recurrent UTI. Patient does not have a history of pyelonephritis.   The following portions of the patient's history were reviewed and updated as appropriate: allergies, current medications, past family history, past medical history, past social history, past surgical history and problem list.  Review of Systems A comprehensive review of systems was negative except for: Integument/breast: positive for rash on back of left thigh, itchy (mostly at night). Feels like she was bitten. Has been using aloe gel which has helped some. Denies recent skin contact with possible irritants, or changes in soaps or detergents.   Objective:    BP 131/78   Pulse 78   Wt 146 lb 8 oz (66.5 kg)   LMP 01/02/2018   BMI 25.95 kg/m  General appearance: alert and no distress Abdomen: soft, non-tender; bowel sounds normal; no masses,  no organomegaly  Back: symmetric, no curvature. ROM normal. No CVA tenderness. Pelvic: deferred  Extremities: extremities normal, atraumatic, no cyanosis or edema Skin: mobility and turgor normal and no edema or papular - scattered, upper leg(s) left  Laboratory:  Results for orders placed or performed in visit on 01/21/18  POCT Urinalysis Dipstick  Result Value Ref Range   Color, UA yellow    Clarity, UA clear    Glucose, UA neg    Bilirubin, UA neg    Ketones, UA small 1.5    Spec Grav, UA 1.010 1.010 - 1.025   Blood, UA hemolyzed trace    pH, UA 6.0 5.0 - 8.0   Protein, UA neg    Urobilinogen, UA 0.2 0.2 or 1.0 E.U./dL   Nitrite, UA neg    Leukocytes, UA Negative Negative   Appearance clear    Odor       Assessment:    UTI    H/o recurrent UTI's  Skin rash  Plan:    Medications: nitrofurantoin.  Will order culture.  Maintain adequate hydration. Follow up if symptoms not improving, and as needed.   Patient notes she will also begin taking a urinary probiotic. Also encouraged moist wipes alternating with tissue.  Advised on hydrocortisone cream for papular rash.    Hildred Laserherry, Bartt Gonzaga, MD [facility]

## 2018-01-21 NOTE — Progress Notes (Signed)
PT stated no burning when urinating/lower left back pain/ urine freq a lot but normal stream

## 2018-01-21 NOTE — Patient Instructions (Signed)

## 2018-01-22 LAB — URINE CULTURE: Organism ID, Bacteria: NO GROWTH

## 2018-01-28 ENCOUNTER — Telehealth: Payer: Self-pay | Admitting: Obstetrics and Gynecology

## 2018-01-28 NOTE — Telephone Encounter (Signed)
Patient called stating she finished her antibiotics this morning but is still experiencing frequency and low back pain. Please advise.

## 2018-02-04 ENCOUNTER — Ambulatory Visit (INDEPENDENT_AMBULATORY_CARE_PROVIDER_SITE_OTHER): Payer: Medicaid Other | Admitting: Obstetrics and Gynecology

## 2018-02-04 ENCOUNTER — Encounter: Payer: Self-pay | Admitting: Obstetrics and Gynecology

## 2018-02-04 VITALS — BP 138/81 | HR 85 | Ht 64.0 in | Wt 145.0 lb

## 2018-02-04 DIAGNOSIS — N898 Other specified noninflammatory disorders of vagina: Secondary | ICD-10-CM

## 2018-02-04 DIAGNOSIS — R8290 Unspecified abnormal findings in urine: Secondary | ICD-10-CM | POA: Diagnosis not present

## 2018-02-04 DIAGNOSIS — R109 Unspecified abdominal pain: Secondary | ICD-10-CM | POA: Diagnosis not present

## 2018-02-04 MED ORDER — CYCLOBENZAPRINE HCL 10 MG PO TABS: 10.0000 mg | ORAL_TABLET | Freq: Three times a day (TID) | ORAL | 2 refills | Status: DC | PRN

## 2018-02-04 NOTE — Patient Instructions (Signed)
Flank Pain, Adult Flank pain is pain that is located on the side of the body between the upper abdomen and the back. This area is called the flank. The pain may occur over a short period of time (acute), or it may be long-term or recurring (chronic). It may be mild or severe. Flank pain can be caused by many things, including:  Muscle soreness or injury.  Kidney stones or kidney disease.  Stress.  A disease of the spine (vertebral disk disease).  A lung infection (pneumonia).  Fluid around the lungs (pulmonary edema).  A skin rash caused by the chickenpox virus (shingles).  Tumors that affect the back of the abdomen.  Gallbladder disease.  Follow these instructions at home:  Drink enough fluid to keep your urine clear or pale yellow.  Rest as told by your health care provider.  Take over-the-counter and prescription medicines only as told by your health care provider.  Keep a journal to track what has caused your flank pain and what has made it feel better.  Keep all follow-up visits as told by your health care provider. This is important. Contact a health care provider if:  Your pain is not controlled with medicine.  You have new symptoms.  Your pain gets worse.  You have a fever.  Your symptoms last longer than 2-3 days.  You have trouble urinating or you are urinating very frequently. Get help right away if:  You have trouble breathing or you are short of breath.  Your abdomen hurts or it is swollen or red.  You have nausea or vomiting.  You feel faint or you pass out.  You have blood in your urine. Summary  Flank pain is pain that is located on the side of the body between the upper abdomen and the back.  The pain may occur over a short period of time (acute), or it may be long-term or recurring (chronic). It may be mild or severe.  Flank pain can be caused by many things.  Contact your health care provider if your symptoms get worse or they last  longer than 2-3 days. This information is not intended to replace advice given to you by your health care provider. Make sure you discuss any questions you have with your health care provider. Document Released: 01/29/2006 Document Revised: 02/20/2017 Document Reviewed: 02/20/2017 Elsevier Interactive Patient Education  2018 [employer]  

## 2018-02-04 NOTE — Progress Notes (Signed)
    GYNECOLOGY PROGRESS NOTE  Subjective:    Patient ID: Webb LawsCristina Girgis, female    DOB: 02-21-1986, 32 y.o.   MRN: [id number]  HPI  Patient is a 32 y.o. 561P1001 female who presents for complaints of continued left flank pain and malodorous urine.  She was seen last week with UTI symptoms, and has had a h/o UTI's in the past.  She was treated empirically with Macrobid, and urine culture returned negative.  She notes that the dysuria has resolved but is still noting low back pain. Denies hematuria.  Denies any recent dietary changes or any strenuous activities.   Patient states that she went through an episode like this before ~ 3 years ago (but pain was more in the front and radiated to the back). She underwent a full workup, including being seen by GI, Urology and her PCP. She has had an endoscopy performed. She was given pain medication, but nothing helped. The pain eventually resolved on it's own after several months.   The following portions of the patient's history were reviewed and updated as appropriate: allergies, current medications, past family history, past medical history, past social history, past surgical history and problem list.  Review of Systems Pertinent items noted in HPI and remainder of comprehensive ROS otherwise negative.   Objective:   Blood pressure 138/81, pulse 85, height 5\' 4"  (1.626 m), weight 145 lb (65.8 kg), last menstrual period 02/02/2018. General appearance: alert and no distress Abdomen: soft, non-tender; bowel sounds normal; no masses,  no organomegaly  Back: symmetric, no curvature. ROM normal. No CVA tenderness. Pelvic: external genitalia normal, rectovaginal septum normal.  Vagina with small amount discharge.  Cervix normal appearing, no lesions and no motion tenderness.  Uterus mobile, nontender, normal shape and size.  Adnexae non-palpable, nontender bilaterally.  Extremities: extremities normal, atraumatic, no cyanosis or edema Neurologic: Grossly  normal   Assessment:   Flank pain Malodorous urine Vaginal discharge   Plan:   - UA and culture recently negative.  Still with flank pain. UA malodorous urine per patient however no unusual odor detected on today's specimen.  Ordered CT scan to assess for renal stones.  Discussed trial of muscle relaxers to help with pain.  - Also ordered Nuswab in case odor is actually vaginal, in light of discharge present.   Hildred Laserherry, Pharell Rolfson, MD [facility]

## 2018-02-04 NOTE — Telephone Encounter (Signed)
Pt seen the doctor today.

## 2018-02-08 LAB — NUSWAB VAGINITIS PLUS (VG+)
CANDIDA ALBICANS, NAA: NEGATIVE
CANDIDA GLABRATA, NAA: NEGATIVE
CHLAMYDIA TRACHOMATIS, NAA: NEGATIVE
NEISSERIA GONORRHOEAE, NAA: NEGATIVE
Trich vag by NAA: NEGATIVE

## 2018-02-15 ENCOUNTER — Ambulatory Visit: Admission: RE | Admit: 2018-02-15 | Payer: Self-pay | Source: Ambulatory Visit

## 2018-03-09 ENCOUNTER — Encounter: Payer: BLUE CROSS/BLUE SHIELD | Admitting: Obstetrics and Gynecology

## 2018-03-25 ENCOUNTER — Telehealth: Payer: Self-pay | Admitting: Obstetrics and Gynecology

## 2018-03-25 NOTE — Telephone Encounter (Signed)
Spoke with pt about her bill. Pt was thinking that it was an error but I reviewed her chart and the nuswab was done. Pt was informed that [facility] but stated that she was at work and was about to clock in. Pt was informed that [facility] would be informed to give her a call back concerning her bill. [facility].

## 2018-03-25 NOTE — Telephone Encounter (Signed)
The patient called and state that she would like to speak with Dr. Valentino Saxonherry or her nurse in regards to a bill sent from [employer] that needs to be filed differently. Please advise.

## 2018-04-04 ENCOUNTER — Other Ambulatory Visit: Payer: Self-pay | Admitting: Obstetrics and Gynecology

## 2018-04-04 DIAGNOSIS — Z30011 Encounter for initial prescription of contraceptive pills: Secondary | ICD-10-CM

## 2018-04-29 ENCOUNTER — Other Ambulatory Visit: Payer: Self-pay | Admitting: Obstetrics and Gynecology

## 2018-04-29 DIAGNOSIS — Z30011 Encounter for initial prescription of contraceptive pills: Secondary | ICD-10-CM

## 2018-05-03 ENCOUNTER — Telehealth: Payer: Self-pay | Admitting: *Deleted

## 2018-05-03 NOTE — Telephone Encounter (Signed)
Patient called and didn't disclose what she wanted to talk to the nurse about. Patient is requesting a call back. Her contact # is [phone]. Please advise. Thank you

## 2018-05-05 NOTE — Telephone Encounter (Signed)
Pt was called back and spoke with her concerning her receiving a voicemail from the office.

## 2018-05-07 ENCOUNTER — Other Ambulatory Visit: Payer: Self-pay

## 2018-05-07 ENCOUNTER — Telehealth: Payer: Self-pay | Admitting: Obstetrics and Gynecology

## 2018-05-07 DIAGNOSIS — Z30011 Encounter for initial prescription of contraceptive pills: Secondary | ICD-10-CM

## 2018-05-07 MED ORDER — NORETHIN ACE-ETH ESTRAD-FE 1-20 MG-MCG PO TABS: 1.0000 | ORAL_TABLET | Freq: Every day | ORAL | 1 refills | Status: DC

## 2018-05-07 NOTE — Telephone Encounter (Signed)
Pt was called and informed that her medication was refilled and sent to the pharmacy. Encouraged pt to keep her appointment for her annual.

## 2018-05-07 NOTE — Telephone Encounter (Signed)
Patient requests refill on Junel FE; has appt for physical scheduled 06/09/18.

## 2018-06-05 ENCOUNTER — Other Ambulatory Visit: Payer: Self-pay | Admitting: Obstetrics and Gynecology

## 2018-06-05 DIAGNOSIS — Z30011 Encounter for initial prescription of contraceptive pills: Secondary | ICD-10-CM

## 2018-06-09 ENCOUNTER — Encounter: Payer: Self-pay | Admitting: Obstetrics and Gynecology

## 2018-06-22 ENCOUNTER — Other Ambulatory Visit: Payer: Self-pay | Admitting: Obstetrics and Gynecology

## 2018-06-22 DIAGNOSIS — Z30011 Encounter for initial prescription of contraceptive pills: Secondary | ICD-10-CM

## 2018-06-22 NOTE — Telephone Encounter (Signed)
Patient needs annual exam prior to further refills being given.

## 2018-06-25 ENCOUNTER — Other Ambulatory Visit: Payer: Self-pay

## 2018-07-07 ENCOUNTER — Encounter: Payer: Self-pay | Admitting: Obstetrics and Gynecology

## 2018-07-07 ENCOUNTER — Telehealth: Payer: Self-pay | Admitting: Obstetrics and Gynecology

## 2018-07-07 ENCOUNTER — Ambulatory Visit (INDEPENDENT_AMBULATORY_CARE_PROVIDER_SITE_OTHER): Payer: Managed Care, Other (non HMO) | Admitting: Obstetrics and Gynecology

## 2018-07-07 ENCOUNTER — Other Ambulatory Visit: Payer: Self-pay

## 2018-07-07 VITALS — BP 128/76 | HR 76 | Ht 62.0 in | Wt 144.3 lb

## 2018-07-07 DIAGNOSIS — E663 Overweight: Secondary | ICD-10-CM

## 2018-07-07 DIAGNOSIS — N898 Other specified noninflammatory disorders of vagina: Secondary | ICD-10-CM

## 2018-07-07 DIAGNOSIS — Z30011 Encounter for initial prescription of contraceptive pills: Secondary | ICD-10-CM

## 2018-07-07 DIAGNOSIS — N390 Urinary tract infection, site not specified: Secondary | ICD-10-CM

## 2018-07-07 DIAGNOSIS — Z309 Encounter for contraceptive management, unspecified: Secondary | ICD-10-CM | POA: Diagnosis not present

## 2018-07-07 DIAGNOSIS — Z01419 Encounter for gynecological examination (general) (routine) without abnormal findings: Secondary | ICD-10-CM

## 2018-07-07 MED ORDER — NORETHIN ACE-ETH ESTRAD-FE 1-20 MG-MCG PO TABS: 1.0000 | ORAL_TABLET | Freq: Every day | ORAL | 11 refills | Status: DC

## 2018-07-07 NOTE — Telephone Encounter (Signed)
Pt was called and informed that her BC was refilled.

## 2018-07-07 NOTE — Progress Notes (Signed)
GYNECOLOGY ANNUAL PHYSICAL EXAM PROGRESS NOTE  Subjective:    Brooke Koch is a 32 y.o. G4P1001 female who presents to establish care, and for an annual exam.  The patient is sexually active.  The patient wears seatbelts: yes. The patient participates in regular exercise: yes. Has the patient ever been transfused or tattooed?: no. The patient reports that there is not domestic violence in her life.     The patient has the following complaints today:  1. Vaginal itching for the past several days.   2. History of recurrent UTI's. Has had 2 more since diagnosed in February.   Gynecologic History Menarche age: 56 Patient's last menstrual period was 07/02/2018.  Menstrual cycles are regular.  Contraception: OCPs (combined) History of STI's: Denies Last Pap: 02/2017. Results were: normal.  Denies h/o abnormal pap smears.   OB History  Gravida Para Term Preterm AB Living  1 1 1  0 0 1  SAB TAB Ectopic Multiple Live Births  0 0 0 0 1    # Outcome Date GA Lbr Len/2nd Weight Sex Delivery Anes PTL Lv  1 Term 06/09/11 [redacted]w[redacted]d  5 lb 10 oz (2.551 kg) F Vag-Spont   LIV     Birth Comments: Cleft Hand     Past Medical History:  Diagnosis Date  . Gastritis   . History of Helicobacter pylori infection     Past Surgical History:  Procedure Laterality Date  . ESOPHAGOGASTRODUODENOSCOPY  12/2009    Family History  Problem Relation Age of Onset  . Hypertension Mother   . Glaucoma Mother   . Hyperlipidemia Mother   . Hypertension Father   . Diabetes Father   . Prostate cancer Father        cancer free x 1 year    Social History   Socioeconomic History  . Marital status: Single    Spouse name: Not on file  . Number of children: Not on file  . Years of education: Not on file  . Highest education level: Not on file  Occupational History  . Not on file  Social Needs  . Financial resource strain: Not on file  . Food insecurity:    Worry: Not on file    Inability: Not on file    . Transportation needs:    Medical: Not on file    Non-medical: Not on file  Tobacco Use  . Smoking status: Never Smoker  . Smokeless tobacco: Never Used  Substance and Sexual Activity  . Alcohol use: Yes    Alcohol/week: 0.0 oz    Comment: social drinker  . Drug use: No  . Sexual activity: Yes    Birth control/protection: Pill  Lifestyle  . Physical activity:    Days per week: Not on file    Minutes per session: Not on file  . Stress: Not on file  Relationships  . Social connections:    Talks on phone: Not on file    Gets together: Not on file    Attends religious service: Not on file    Active member of club or organization: Not on file    Attends meetings of clubs or organizations: Not on file    Relationship status: Not on file  . Intimate partner violence:    Fear of current or ex partner: Not on file    Emotionally abused: Not on file    Physically abused: Not on file    Forced sexual activity: Not on file  Other Topics  Concern  . Not on file  Social History Narrative  . Not on file    Current Outpatient Medications on File Prior to Visit  Medication Sig Dispense Refill  . JUNEL FE 1/20 1-20 MG-MCG tablet TAKE 1 TABLET BY MOUTH EVERY DAY 28 tablet 0  . cephALEXin (KEFLEX) 500 MG capsule Take 500 mg by mouth 2 (two) times daily.    . clindamycin (CLEOCIN T) 1 % lotion Apply topically 2 (two) times daily.    . cyclobenzaprine (FLEXERIL) 10 MG tablet Take 1 tablet (10 mg total) by mouth 3 (three) times daily as needed for muscle spasms. (Patient not taking: Reported on 07/07/2018) 30 tablet 2   No current facility-administered medications on file prior to visit.     Allergies  Allergen Reactions  . Septra [Sulfamethoxazole-Trimethoprim] Hives      Review of Systems Constitutional: negative for chills, fatigue, fevers and sweats Eyes: negative for irritation, redness and visual disturbance Ears, nose, mouth, throat, and face: negative for hearing loss,  nasal congestion, snoring and tinnitus Respiratory: negative for asthma, cough, sputum Cardiovascular: negative for chest pain, dyspnea, exertional chest pressure/discomfort, irregular heart beat, palpitations and syncope Gastrointestinal: negative for abdominal pain, change in bowel habits, nausea and vomiting Genitourinary: negative for abnormal menstrual periods, genital lesions, sexual problems and vaginal discharge, dysuria and urinary incontinence. Positive for vaginal itching and recurrent UTI's.  Integument/breast: negative for breast lump, breast tenderness and nipple discharge Hematologic/lymphatic: negative for bleeding and easy bruising Musculoskeletal:negative for back pain and muscle weakness Neurological: negative for dizziness, headaches, vertigo and weakness Endocrine: negative for diabetic symptoms including polydipsia, polyuria and skin dryness Allergic/Immunologic: negative for hay fever and urticaria        Objective:  Blood pressure 128/76, pulse 76, height 5\' 2"  (1.575 m), weight 144 lb 4.8 oz (65.5 kg), last menstrual period 07/02/2018. Body mass index is 26.39 kg/m.  General Appearance:    Alert, cooperative, no distress, appears stated age, overweight  Head:    Normocephalic, without obvious abnormality, atraumatic  Eyes:    PERRL, conjunctiva/corneas clear, EOM's intact, both eyes  Ears:    Normal external ear canals, both ears  Nose:   Nares normal, septum midline, mucosa normal, no drainage or sinus tenderness  Throat:   Lips, mucosa, and tongue normal; teeth and gums normal  Neck:   Supple, symmetrical, trachea midline, no adenopathy; thyroid: no enlargement/tenderness/nodules; no carotid bruit or JVD  Back:     Symmetric, no curvature, ROM normal, no CVA tenderness  Lungs:     Clear to auscultation bilaterally, respirations unlabored  Chest Wall:    No tenderness or deformity   Heart:    Regular rate and rhythm, S1 and S2 normal, no murmur, rub or gallop   Breast Exam:    No tenderness, masses, or nipple abnormality  Abdomen:     Soft, non-tender, bowel sounds active all four quadrants, no masses, no organomegaly.    Genitalia:    Pelvic:external genitalia normal, vagina without lesions, small amount of dark brown blood. Rectovaginal septum  normal. Cervix normal in appearance, no cervical motion tenderness, no adnexal masses or tenderness.  Uterus normal size, shape, mobile, regular contours, nontender.  Rectal:    Normal external sphincter.  No hemorrhoids appreciated. Internal exam not done.   Extremities:   Extremities normal, atraumatic, no cyanosis or edema  Pulses:   2+ and symmetric all extremities  Skin:   Skin color, texture, turgor normal, no rashes or lesions  Lymph  nodes:   Cervical, supraclavicular, and axillary nodes normal  Neurologic:   CNII-XII intact, normal strength, sensation and reflexes throughout   .  Lab:  Microscopic wet-mount exam shows negative for pathogens, normal epithelial cells. Few RBCs.    Assessment:    Healthy female exam.   Overweight  Vaginal itching Recurrent UTI's.   Plan:    Blood tests: No labs ordered as she will have labs performed by an outside PCP in the next several months. Breast self exam technique reviewed and patient encouraged to perform self-exam monthly. Contraception: OCPs.  Patient does note occasional inconsistencies with use. Advised on back up method.  Discussed healthy lifestyle modifications.   Pap smear up to date. Wet prep negative for pathogens. Notes itching usually occurs around time of cycle. Advised on coconut oil -soaked tampon use prior to and after menses. Advised on changing pads/liners regularly, avoid scents.  Recurrent UTI's, not associated with sexual activity. Discussed alternating wet and dry tissues, showering prior to (and after) sexual activity, ensure appropriate wiping hygiene. If no change in symptoms, may need suppression therapy.  Follow up in 1  year for annual exam. .     Hildred Laser, MD [facility]

## 2018-07-07 NOTE — Telephone Encounter (Signed)
Patient Brooke Koch stating she forgot to ask Dr. Valentino Saxonherry for a refill of her BCPs.  Good call back number is [phone], please advise, thanks.

## 2018-07-07 NOTE — Patient Instructions (Signed)

## 2018-07-07 NOTE — Progress Notes (Signed)
Pt stated that she is having some itching in the vaginal area. No other complaints.

## 2018-09-29 ENCOUNTER — Encounter: Payer: Self-pay | Admitting: Emergency Medicine

## 2018-09-29 ENCOUNTER — Emergency Department
Admission: EM | Admit: 2018-09-29 | Discharge: 2018-09-29 | Disposition: A | Discharge status: Home / Self Care | Payer: Managed Care, Other (non HMO) | Attending: Emergency Medicine | Admitting: Emergency Medicine

## 2018-09-29 DIAGNOSIS — R1013 Epigastric pain: Secondary | ICD-10-CM

## 2018-09-29 DIAGNOSIS — R1084 Generalized abdominal pain: Secondary | ICD-10-CM | POA: Diagnosis present

## 2018-09-29 DIAGNOSIS — R197 Diarrhea, unspecified: Secondary | ICD-10-CM

## 2018-09-29 DIAGNOSIS — Z79899 Other long term (current) drug therapy: Secondary | ICD-10-CM | POA: Insufficient documentation

## 2018-09-29 LAB — LIPASE, BLOOD: LIPASE: 30 U/L (ref 11–51)

## 2018-09-29 LAB — COMPREHENSIVE METABOLIC PANEL
ALK PHOS: 45 U/L (ref 38–126)
ALT: 15 U/L (ref 0–44)
AST: 23 U/L (ref 15–41)
Albumin: 3.8 g/dL (ref 3.5–5.0)
Anion gap: 7 (ref 5–15)
BILIRUBIN TOTAL: 0.4 mg/dL (ref 0.3–1.2)
BUN: 6 mg/dL (ref 6–20)
CALCIUM: 8.4 mg/dL — AB (ref 8.9–10.3)
CO2: 26 mmol/L (ref 22–32)
Chloride: 107 mmol/L (ref 98–111)
Creatinine, Ser: 0.6 mg/dL (ref 0.44–1.00)
GFR calc non Af Amer: >60 mL/min (ref >60)
Glucose, Bld: 92 mg/dL (ref 70–99)
POTASSIUM: 3.2 mmol/L — AB (ref 3.5–5.1)
SODIUM: 140 mmol/L (ref 135–145)
TOTAL PROTEIN: 6.8 g/dL (ref 6.5–8.1)

## 2018-09-29 LAB — CBC WITH DIFFERENTIAL/PLATELET
ABS IMMATURE GRANULOCYTES: 0.04 10*3/uL (ref 0.00–0.07)
BASOS PCT: 0 %
Basophils Absolute: 0 10*3/uL (ref 0.0–0.1)
Eosinophils Absolute: 0.2 10*3/uL (ref 0.0–0.5)
Eosinophils Relative: 2 %
HCT: 39.8 % (ref 36.0–46.0)
Hemoglobin: 13.4 g/dL (ref 12.0–15.0)
IMMATURE GRANULOCYTES: 0 %
LYMPHS ABS: 0.7 10*3/uL (ref 0.7–4.0)
LYMPHS PCT: 7 %
MCH: 27.8 pg (ref 26.0–34.0)
MCHC: 33.7 g/dL (ref 30.0–36.0)
MCV: 82.6 fL (ref 80.0–100.0)
MONOS PCT: 5 %
Monocytes Absolute: 0.5 10*3/uL (ref 0.1–1.0)
NEUTROS ABS: 8.6 10*3/uL — AB (ref 1.7–7.7)
NEUTROS PCT: 86 %
PLATELETS: 251 10*3/uL (ref 150–400)
RBC: 4.82 MIL/uL (ref 3.87–5.11)
RDW: 12.5 % (ref 11.5–15.5)
WBC: 10.1 10*3/uL (ref 4.0–10.5)
nRBC: 0 % (ref 0.0–0.2)

## 2018-09-29 LAB — URINALYSIS, COMPLETE (UACMP) WITH MICROSCOPIC
BILIRUBIN URINE: NEGATIVE
GLUCOSE, UA: NEGATIVE mg/dL
Ketones, ur: NEGATIVE mg/dL
LEUKOCYTES UA: NEGATIVE
NITRITE: NEGATIVE
PH: 6 (ref 5.0–8.0)
Protein, ur: NEGATIVE mg/dL
SPECIFIC GRAVITY, URINE: 1.004 — AB (ref 1.005–1.030)

## 2018-09-29 LAB — PREGNANCY, URINE: Preg Test, Ur: NEGATIVE

## 2018-09-29 MED ORDER — POTASSIUM CHLORIDE CRYS ER 20 MEQ PO TBCR
40.0000 meq | EXTENDED_RELEASE_TABLET | Freq: Once | ORAL | Status: AC
Start: 2018-09-29 — End: 2018-09-29
  Administered 2018-09-29: 40 meq via ORAL
  Filled 2018-09-29: qty 2

## 2018-09-29 MED ORDER — SODIUM CHLORIDE 0.9 % IV BOLUS
1000.0000 mL | Freq: Once | INTRAVENOUS | Status: AC
  Administered 2018-09-29: 1000 mL via INTRAVENOUS

## 2018-09-29 MED ORDER — ONDANSETRON HCL 4 MG/2ML IJ SOLN: 4.0000 mg | Freq: Once | INTRAMUSCULAR | Status: DC

## 2018-09-29 MED ORDER — LOPERAMIDE HCL 2 MG PO CAPS
4.0000 mg | ORAL_CAPSULE | Freq: Once | ORAL | Status: AC
  Administered 2018-09-29: 4 mg via ORAL
  Filled 2018-09-29: qty 2

## 2018-09-29 MED ORDER — FAMOTIDINE IN NACL 20-0.9 MG/50ML-% IV SOLN
20.0000 mg | Freq: Once | INTRAVENOUS | Status: AC
  Administered 2018-09-29: 20 mg via INTRAVENOUS
  Filled 2018-09-29: qty 50

## 2018-09-29 NOTE — ED Notes (Signed)
Pt has not any BMs since arrival

## 2018-09-29 NOTE — Discharge Instructions (Signed)
Take pepcid 20 mg twice daily as needed.   Stay hydrated. Drink plenty of fluids   Take imodium every time you have a diarrhea. You can take it up to 10 pills a day   See your doctor   Return to ER if you have dehydration, vomiting, abdominal pain

## 2018-09-29 NOTE — ED Triage Notes (Signed)
Pt arrives with complaints of lower left and right abdominal pain that started Monday. Pt reports the pain as sharp/cramping. Pt also reports diarrhea that started yesterday

## 2018-09-29 NOTE — ED Notes (Signed)
Pt attempted to urinate. Nurse instructed pt to try after she had received more fluids.

## 2018-09-29 NOTE — ED Provider Notes (Signed)
[facility] EMERGENCY DEPARTMENT Provider Note   CSN: [id number] Arrival date & time: 09/29/18  4098     History   Chief Complaint Chief Complaint  Patient presents with  . Abdominal Pain    HPI Brooke Koch is a 32 y.o. female history of gastritis here presenting with abdominal pain, nausea, diarrhea.  Patient states that she has been having diffuse abdominal cramps for the last 2 days.  Patient states that the cramps are intermittent and gets more constant since last night.  Was unable to sleep due to the pain.  Denies any radiation to the pain.  She states that she has 7 episodes of diarrhea since yesterday.  Denies any sick contacts.  She felt nauseated but has no vomiting or fevers.  Nuys any recent travel or eating uncooked food.   The history is provided by the patient.    Past Medical History:  Diagnosis Date  . Gastritis   . History of Helicobacter pylori infection     Patient Active Problem List   Diagnosis Date Noted  . History of recurrent UTIs 06/16/2017  . Overweight (BMI 25.0-29.9) 03/08/2017    Past Surgical History:  Procedure Laterality Date  . ESOPHAGOGASTRODUODENOSCOPY  12/2009     OB History    Gravida  1   Para  1   Term  1   Preterm      AB      Living  1     SAB      TAB      Ectopic      Multiple      Live Births  1            Home Medications    Prior to Admission medications   Medication Sig Start Date End Date Taking? Authorizing Provider  ACZONE 7.5 % GEL Apply 1 application topically daily. To face 08/20/18  Yes [provider]  norethindrone-ethinyl estradiol (JUNEL FE 1/20) 1-20 MG-MCG tablet Take 1 tablet by mouth daily. 07/07/18  Yes Hildred Laser, MD  VELTIN gel Apply 1 application topically every evening. 08/20/18  Yes [provider]  cyclobenzaprine (FLEXERIL) 10 MG tablet Take 1 tablet (10 mg total) by mouth 3 (three) times daily as needed for muscle  spasms. Patient not taking: Reported on 07/07/2018 02/04/18   Hildred Laser, MD    Family History Family History  Problem Relation Age of Onset  . Hypertension Mother   . Glaucoma Mother   . Hyperlipidemia Mother   . Hypertension Father   . Diabetes Father   . Prostate cancer Father        cancer free x 1 year    Social History Social History   Tobacco Use  . Smoking status: Never Smoker  . Smokeless tobacco: Never Used  Substance Use Topics  . Alcohol use: Yes    Alcohol/week: 0.0 standard drinks    Comment: social drinker  . Drug use: No     Allergies   Septra [sulfamethoxazole-trimethoprim]   Review of Systems Review of Systems  Gastrointestinal: Positive for abdominal pain and diarrhea.  All other systems reviewed and are negative.    Physical Exam Updated Vital Signs BP (!) 150/96 (BP Location: Left Arm)   Pulse 100   Temp 98.3 F (36.8 C) (Oral)   Resp 16   Ht 5\' 2"  (1.575 m)   Wt 65.8 kg   LMP 09/25/2018   SpO2 97%   BMI 26.52 kg/m  Physical Exam  Constitutional: She appears well-developed.  Slightly dehydrated   HENT:  Head: Normocephalic.  Mouth/Throat: Oropharynx is clear and moist.  Eyes: Pupils are equal, round, and reactive to light. EOM are normal.  Cardiovascular: Normal rate, regular rhythm and normal heart sounds.  Pulmonary/Chest: Effort normal and breath sounds normal.  Abdominal: Normal appearance.  Mild epigastric tenderness, no RUQ tenderness and neg murphy's. No periumbilical tenderness   Neurological: She is alert.  Skin: Skin is warm. Capillary refill takes less than 2 seconds.  Nursing note and vitals reviewed.    ED Treatments / Results  Labs (all labs ordered are listed, but only abnormal results are displayed) Labs Reviewed  CBC WITH DIFFERENTIAL/PLATELET - Abnormal; Notable for the following components:      Result Value   Neutro Abs 8.6 (*)    All other components within normal limits  COMPREHENSIVE  METABOLIC PANEL - Abnormal; Notable for the following components:   Potassium 3.2 (*)    Calcium 8.4 (*)    All other components within normal limits  URINALYSIS, COMPLETE (UACMP) WITH MICROSCOPIC - Abnormal; Notable for the following components:   Color, Urine YELLOW (*)    APPearance HAZY (*)    Specific Gravity, Urine 1.004 (*)    Hgb urine dipstick MODERATE (*)    Bacteria, UA MANY (*)    All other components within normal limits  LIPASE, BLOOD  PREGNANCY, URINE    EKG None  Radiology No results found.  Procedures Procedures (including critical care time)  Medications Ordered in ED Medications  ondansetron (ZOFRAN) injection 4 mg (4 mg Intravenous Refused 09/29/18 0850)  sodium chloride 0.9 % bolus 1,000 mL (0 mLs Intravenous Stopped 09/29/18 1012)  loperamide (IMODIUM) capsule 4 mg (4 mg Oral Given 09/29/18 0845)  famotidine (PEPCID) IVPB 20 mg premix (0 mg Intravenous Stopped 09/29/18 1000)     Initial Impression / Assessment and Plan / ED Course  I have reviewed the triage vital signs and the nursing notes.  Pertinent labs & imaging results that were available during my care of the patient were reviewed by me and considered in my medical decision making (see chart for details).    Brooke Koch is a 32 y.o. female here with epigastric pain, diarrhea. I think likely viral gastro. Minimal epigastric tenderness but no RUQ or lower abdominal tenderness. Will check labs, hydrate and reassess.   12:11 PM WBC nl, K 3.2, supplemented. UA showed no obvious UTI. No vomiting in the ED. No diarrhea after imodium. LFTs nl. No abdominal tenderness now. Likely viral gastro. Will dc home with imodium prn, pepcid prn      Final Clinical Impressions(s) / ED Diagnoses   Final diagnoses:  None    ED Discharge Orders    None       Charlynne Pander, MD 09/29/18 1213

## 2018-09-29 NOTE — ED Triage Notes (Signed)
First Nurse Note: C/O generalized abdominal pain x 2 days.  Patient is AAOx3.  Skin warm and dry. NAD.

## 2019-01-12 ENCOUNTER — Ambulatory Visit (INDEPENDENT_AMBULATORY_CARE_PROVIDER_SITE_OTHER): Payer: Managed Care, Other (non HMO) | Admitting: Obstetrics and Gynecology

## 2019-01-12 ENCOUNTER — Encounter: Payer: Self-pay | Admitting: Obstetrics and Gynecology

## 2019-01-12 VITALS — BP 122/80 | HR 65 | Ht 62.0 in | Wt 148.0 lb

## 2019-01-12 DIAGNOSIS — N926 Irregular menstruation, unspecified: Secondary | ICD-10-CM

## 2019-01-12 DIAGNOSIS — N921 Excessive and frequent menstruation with irregular cycle: Secondary | ICD-10-CM | POA: Diagnosis not present

## 2019-01-12 DIAGNOSIS — Z3202 Encounter for pregnancy test, result negative: Secondary | ICD-10-CM

## 2019-01-12 DIAGNOSIS — R030 Elevated blood-pressure reading, without diagnosis of hypertension: Secondary | ICD-10-CM

## 2019-01-12 LAB — POCT URINE PREGNANCY: Preg Test, Ur: NEGATIVE

## 2019-01-12 NOTE — Progress Notes (Signed)
Pt is present today due to having irregular cycles while on birth control Junel. Pt stated that her cycles are 20 days to 2 weeks apart, she had 2 cycles in one month, current cycle has been on for 10 days now started on 01/01/19, with heavy bleeding, clots, dark brown blood, with mild cramping. Pt is concerned about her cycles at this time.

## 2019-01-12 NOTE — Progress Notes (Signed)
    GYNECOLOGY PROGRESS NOTE  Subjective:    Patient ID: Brooke Koch, female    DOB: [birth year], 33 y.o.   MRN: [id number]  HPI  Patient is a 33 y.o. G72P1001 female who presents for complaints of irregular cycles for the past 4-6 months. She is currently on combined OCPs (Loestrine Fe). Her cycles are coming every 14-20 days, lasting for 4-7 days, however most recent cycle has been ongoing for 10 days. Bleeding is dark brown with clots, and associated with mild cramping. Most days bleeding is light, only requiring the use of a pantyliner. Denies abnormal vaginal discharge. Denies missing any pills  The following portions of the patient's history were reviewed and updated as appropriate: allergies, current medications, past family history, past medical history, past social history, past surgical history and problem list.  Review of Systems Pertinent items noted in HPI and remainder of comprehensive ROS otherwise negative.   Objective:   Blood pressure (!) 143/92, pulse 65, height 5\' 2"  (1.575 m), weight 148 lb (67.1 kg), last menstrual period 01/01/2019. General appearance: alert and no distress Abdomen: soft, non-tender; bowel sounds normal; no masses,  no organomegaly Pelvic: external genitalia normal, rectovaginal septum normal.  Vagina with small amount of dark red/brown discharge.  Cervix normal appearing, no lesions and no motion tenderness.  Uterus mobile, nontender, normal shape and size.  Adnexae non-palpable, nontender bilaterally.  Extremities: extremities normal, atraumatic, no cyanosis or edema Neurologic: Grossly normal   Assessment:    Breakthrough bleeding on OCPs Irregular menses Elevated HTN  Plan:   1. UPT performed today, negative. Discussed possible etiologies of abnormal bleeding, including need to increase hormonal dosing of OCPs, structural causes such as fibroids or polyps, etc.  Will change from 20 mcg to 30 mcg dosing. Will also order ultrasound to be  performed in 1 month if bleeding does not improve with new pills.  2. BP repeated. No h/o elevated BP in the past. Repeat BP normal, 122/80.     Hildred Laser, MD [facility] 01/12/2019 9:33 AM

## 2019-01-17 ENCOUNTER — Telehealth: Payer: Self-pay

## 2019-01-17 NOTE — Telephone Encounter (Signed)
Pt called stating that [facility] was going to change her medication BC to another brand or dose and the pharmacy did not receive any medication for the pt.

## 2019-01-17 NOTE — Telephone Encounter (Signed)
Sent message to AC.  

## 2019-01-18 ENCOUNTER — Other Ambulatory Visit: Payer: Self-pay

## 2019-01-18 MED ORDER — NORETHIN ACE-ETH ESTRAD-FE 1.5-30 MG-MCG PO TABS: 1.0000 | ORAL_TABLET | Freq: Every day | ORAL | 4 refills | Status: DC

## 2019-01-18 NOTE — Telephone Encounter (Signed)
Spoke with pt and informing her that the birth control pill junel had been increased and sent to her pharmacy [facility] in [place]. Pt stated that she understood and would go by the pharmacy today to pick up her medication.

## 2019-02-02 ENCOUNTER — Telehealth: Payer: Self-pay | Admitting: Obstetrics and Gynecology

## 2019-02-02 NOTE — Telephone Encounter (Signed)
The patient called and stated that she believes she may have a bacterial infection and wants to come in and be seen by someone within the next day or so, The patient is requesting a call back today. Please advise.

## 2019-02-02 NOTE — Telephone Encounter (Signed)
Pt called no answer LM via voicemail to please call the office to set an appointment if she needs to be seen by a provider and informed pt that [facility] was out of the office and would return on the 19th of Feb. Pt was advised to called the office.

## 2019-02-04 NOTE — Telephone Encounter (Signed)
Pt called no answer. Calling pt to see if she needed to be seen by a provider before the March 3rd. No answer LM for pt.

## 2019-02-07 NOTE — Telephone Encounter (Signed)
Pt called no answer LM to call the office to make an appointment to be seen.

## 2019-02-08 ENCOUNTER — Telehealth: Payer: Self-pay | Admitting: Obstetrics and Gynecology

## 2019-02-08 NOTE — Telephone Encounter (Signed)
Patient states the 4th is fine, but her bleeding is better and she wants to cancel anyway, but is that ok, please advise, thanks.

## 2019-02-09 NOTE — Telephone Encounter (Signed)
Pt called and made an appointment to be seen.

## 2019-02-23 ENCOUNTER — Other Ambulatory Visit: Payer: Managed Care, Other (non HMO)

## 2019-02-23 ENCOUNTER — Encounter: Payer: Managed Care, Other (non HMO) | Admitting: Obstetrics and Gynecology

## 2019-03-07 ENCOUNTER — Ambulatory Visit (INDEPENDENT_AMBULATORY_CARE_PROVIDER_SITE_OTHER): Payer: Managed Care, Other (non HMO) | Admitting: Certified Nurse Midwife

## 2019-03-07 ENCOUNTER — Other Ambulatory Visit: Payer: Self-pay

## 2019-03-07 ENCOUNTER — Encounter: Payer: Self-pay | Admitting: Certified Nurse Midwife

## 2019-03-07 VITALS — BP 135/94 | HR 70 | Ht 62.0 in | Wt 144.1 lb

## 2019-03-07 DIAGNOSIS — N898 Other specified noninflammatory disorders of vagina: Secondary | ICD-10-CM | POA: Diagnosis not present

## 2019-03-07 MED ORDER — FLUCONAZOLE 150 MG PO TABS: 150.0000 mg | ORAL_TABLET | Freq: Once | ORAL | 1 refills | Status: AC

## 2019-03-07 MED ORDER — METRONIDAZOLE 500 MG PO TABS: 500.0000 mg | ORAL_TABLET | Freq: Two times a day (BID) | ORAL | 0 refills | Status: AC

## 2019-03-07 NOTE — Patient Instructions (Signed)
Vaginitis  Vaginitis is a condition in which the vaginal tissue swells and becomes red (inflamed). This condition is most often caused by a change in the normal balance of bacteria and yeast that live in the vagina. This change causes an overgrowth of certain bacteria or yeast, which causes the inflammation. There are different types of vaginitis, but the most common types are:   Bacterial vaginosis.   Yeast infection (candidiasis).   Trichomoniasis vaginitis. This is a sexually transmitted disease (STD).   Viral vaginitis.   Atrophic vaginitis.   Allergic vaginitis.  What are the causes?  The cause of this condition depends on the type of vaginitis. It can be caused by:   Bacteria (bacterial vaginosis).   Yeast, which is a fungus (yeast infection).   A parasite (trichomoniasis vaginitis).   A virus (viral vaginitis).   Low hormone levels (atrophic vaginitis). Low hormone levels can occur during pregnancy, breastfeeding, or after menopause.   Irritants, such as bubble baths, scented tampons, and feminine sprays (allergic vaginitis).  Other factors can change the normal balance of the yeast and bacteria that live in the vagina. These include:   Antibiotic medicines.   Poor hygiene.   Diaphragms, vaginal sponges, spermicides, birth control pills, and intrauterine devices (IUD).   Sex.   Infection.   Uncontrolled diabetes.   A weakened defense (immune) system.  What increases the risk?  This condition is more likely to develop in women who:   Smoke.   Use vaginal douches, scented tampons, or scented sanitary pads.   Wear tight-fitting pants.   Wear thong underwear.   Use oral birth control pills or an IUD.   Have sex without a condom.   Have multiple sex partners.   Have an STD.   Frequently use the spermicide nonoxynol-9.   Eat lots of foods high in sugar.   Have uncontrolled diabetes.   Have low estrogen levels.   Have a weakened immune system from an immune disorder or medical  treatment.   Are pregnant or breastfeeding.  What are the signs or symptoms?  Symptoms vary depending on the cause of the vaginitis. Common symptoms include:   Abnormal vaginal discharge.  ? The discharge is white, gray, or yellow with bacterial vaginosis.  ? The discharge is thick, white, and cheesy with a yeast infection.  ? The discharge is frothy and yellow or greenish with trichomoniasis.   A bad vaginal smell. The smell is fishy with bacterial vaginosis.   Vaginal itching, pain, or swelling.   Sex that is painful.   Pain or burning when urinating.  Sometimes there are no symptoms.  How is this diagnosed?  This condition is diagnosed based on your symptoms and medical history. A physical exam, including a pelvic exam, will also be done. You may also have other tests, including:   Tests to determine the pH level (acidity or alkalinity) of your vagina.   A whiff test, to assess the odor that results when a sample of your vaginal discharge is mixed with a potassium hydroxide solution.   Tests of vaginal fluid. A sample will be examined under a microscope.  How is this treated?  Treatment varies depending on the type of vaginitis you have. Your treatment may include:   Antibiotic creams or pills to treat bacterial vaginosis and trichomoniasis.   Antifungal medicines, such as vaginal creams or suppositories, to treat a yeast infection.   Medicine to ease discomfort if you have viral vaginitis. Your sexual partner   should also be treated.   Estrogen delivered in a cream, pill, suppository, or vaginal ring to treat atrophic vaginitis. If vaginal dryness occurs, lubricants and moisturizing creams may help. You may need to avoid scented soaps, sprays, or douches.   Stopping use of a product that is causing allergic vaginitis. Then using a vaginal cream to treat the symptoms.  Follow these instructions at home:  Lifestyle   Keep your genital area clean and dry. Avoid soap, and only rinse the area with  water.   Do not douche or use tampons until your health care provider says it is okay to do so. Use sanitary pads, if needed.   Do not have sex until your health care provider approves. When you can return to sex, practice safe sex and use condoms.   Wipe from front to back. This avoids the spread of bacteria from the rectum to the vagina.  General instructions   Take over-the-counter and prescription medicines only as told by your health care provider.   If you were prescribed an antibiotic medicine, take or use it as told by your health care provider. Do not stop taking or using the antibiotic even if you start to feel better.   Keep all follow-up visits as told by your health care provider. This is important.  How is this prevented?   Use mild, non-scented products. Do not use things that can irritate the vagina, such as fabric softeners. Avoid the following products if they are scented:  ? Feminine sprays.  ? Detergents.  ? Tampons.  ? Feminine hygiene products.  ? Soaps or bubble baths.   Let air reach your genital area.  ? Wear cotton underwear to reduce moisture buildup.  ? Avoid wearing underwear while you sleep.  ? Avoid wearing tight pants and underwear or nylons without a cotton panel.  ? Avoid wearing thong underwear.   Take off any wet clothing, such as bathing suits, as soon as possible.   Practice safe sex and use condoms.  Contact a health care provider if:   You have abdominal pain.   You have a fever.   You have symptoms that last for more than 2-3 days.  Get help right away if:   You have a fever and your symptoms suddenly get worse.  Summary   Vaginitis is a condition in which the vaginal tissue becomes inflamed.This condition is most often caused by a change in the normal balance of bacteria and yeast that live in the vagina.   Treatment varies depending on the type of vaginitis you have.   Do not douche, use tampons , or have sex until your health care provider approves. When  you can return to sex, practice safe sex and use condoms.  This information is not intended to replace advice given to you by your health care provider. Make sure you discuss any questions you have with your health care provider.  Document Released: 10/05/2007 Document Revised: 01/13/2017 Document Reviewed: 01/13/2017  Elsevier Interactive Patient Education  2019 [employer]

## 2019-03-07 NOTE — Progress Notes (Signed)
GYN ENCOUNTER NOTE  Subjective:       Brooke Koch is a 32 y.o. G62P1001 female is here for gynecologic evaluation of the following issues:  1. Increased discharge with odor and itching for a week and a half. Pt states that she treated with monistat a week ago but continue to have symptoms. Jamse Arn possibility of STD.     Gynecologic History Patient's last menstrual period was 02/09/2019 (exact date). Contraception: OCP (estrogen/progesterone) Last Pap: 03/04/17 Results were: normal Last mammogram: n/a.   Obstetric History OB History  Gravida Para Term Preterm AB Living  1 1 1     1   SAB TAB Ectopic Multiple Live Births          1    # Outcome Date GA Lbr Len/2nd Weight Sex Delivery Anes PTL Lv  1 Term 06/09/11 [redacted]w[redacted]d  5 lb 10 oz (2.551 kg) F Vag-Spont   LIV     Birth Comments: Cleft Hand     Past Medical History:  Diagnosis Date  . Gastritis   . History of Helicobacter pylori infection     Past Surgical History:  Procedure Laterality Date  . ESOPHAGOGASTRODUODENOSCOPY  12/2009    Current Outpatient Medications on File Prior to Visit  Medication Sig Dispense Refill  . ACZONE 7.5 % GEL Apply 1 application topically daily. To face  1  . norethindrone-ethinyl estradiol-iron (MICROGESTIN FE,GILDESS FE,LOESTRIN FE) 1.5-30 MG-MCG tablet Take 1 tablet by mouth daily. 3 Package 4  . VELTIN gel Apply 1 application topically every evening.  1   No current facility-administered medications on file prior to visit.     Allergies  Allergen Reactions  . Septra [Sulfamethoxazole-Trimethoprim] Hives    Social History   Socioeconomic History  . Marital status: Single    Spouse name: Not on file  . Number of children: Not on file  . Years of education: Not on file  . Highest education level: Not on file  Occupational History  . Not on file  Social Needs  . Financial resource strain: Not on file  . Food insecurity:    Worry: Not on file    Inability: Not on file  .  Transportation needs:    Medical: Not on file    Non-medical: Not on file  Tobacco Use  . Smoking status: Never Smoker  . Smokeless tobacco: Never Used  Substance and Sexual Activity  . Alcohol use: Yes    Alcohol/week: 0.0 standard drinks    Comment: social drinker  . Drug use: No  . Sexual activity: Yes    Birth control/protection: Pill  Lifestyle  . Physical activity:    Days per week: Not on file    Minutes per session: Not on file  . Stress: Not on file  Relationships  . Social connections:    Talks on phone: Not on file    Gets together: Not on file    Attends religious service: Not on file    Active member of club or organization: Not on file    Attends meetings of clubs or organizations: Not on file    Relationship status: Not on file  . Intimate partner violence:    Fear of current or ex partner: Not on file    Emotionally abused: Not on file    Physically abused: Not on file    Forced sexual activity: Not on file  Other Topics Concern  . Not on file  Social History Narrative  . Not on file  Family History  Problem Relation Age of Onset  . Hypertension Mother   . Glaucoma Mother   . Hyperlipidemia Mother   . Hypertension Father   . Diabetes Father   . Prostate cancer Father        cancer free x 1 year    The following portions of the patient's history were reviewed and updated as appropriate: allergies, current medications, past family history, past medical history, past social history, past surgical history and problem list.  Review of Systems Review of Systems - Negative except as mentioned in HPI Review of Systems - General ROS: negative for - chills, fatigue, fever, hot flashes, malaise or night sweats Hematological and Lymphatic ROS: negative for - bleeding problems or swollen lymph nodes Gastrointestinal ROS: negative for - abdominal pain, blood in stools, change in bowel habits and nausea/vomiting Musculoskeletal ROS: negative for - joint pain,  muscle pain or muscular weakness Genito-Urinary ROS: negative for - change in menstrual cycle, dysmenorrhea, dyspareunia, dysuria, , genital ulcers, hematuria, incontinence, irregular/heavy menses, nocturia or pelvic pain. Positive for genital discharge with itching and odor  Objective:   BP (!) 135/94   Pulse 70   Ht 5\' 2"  (1.575 m)   Wt 144 lb 1 oz (65.3 kg)   LMP 02/09/2019 (Exact Date)   BMI 26.35 kg/m  CONSTITUTIONAL: Well-developed, well-nourished female in no acute distress.  HENT:  Normocephalic, atraumatic.  NECK: Normal range of motion, supple, no masses.  Normal thyroid.  SKIN: Skin is warm and dry. No rash noted. Not diaphoretic. No erythema. No pallor. NEUROLGIC: Alert and oriented to person, place, and time. PSYCHIATRIC: Normal mood and affect. Normal behavior. Normal judgment and thought content. CARDIOVASCULAR:Not Examined RESPIRATORY: Not Examined BREASTS: Not Examined ABDOMEN: Soft, non distended; Non tender.  No Organomegaly. PELVIC:  External Genitalia: Normal  BUS: Normal  Vagina: Normal, yellowish discharge with odor   Cervix: Normal MUSCULOSKELETAL: Normal range of motion. No tenderness.  No cyanosis, clubbing, or edema.  Wet mount: positive clue cells and whiff test.    Assessment:   Vaginal discharge    Plan:   Flagyl placed. Pt state she is susceptible for yeast infection. Order for diflucan as well. Instructions reviewed on use. Follow up if not improving or PRN.

## 2019-05-06 ENCOUNTER — Encounter: Payer: Self-pay | Admitting: Obstetrics and Gynecology

## 2019-05-06 ENCOUNTER — Other Ambulatory Visit: Payer: Self-pay

## 2019-05-06 ENCOUNTER — Ambulatory Visit (INDEPENDENT_AMBULATORY_CARE_PROVIDER_SITE_OTHER): Payer: Managed Care, Other (non HMO) | Admitting: Obstetrics and Gynecology

## 2019-05-06 VITALS — BP 135/84 | HR 68 | Ht 64.0 in | Wt 145.1 lb

## 2019-05-06 DIAGNOSIS — N3001 Acute cystitis with hematuria: Secondary | ICD-10-CM

## 2019-05-06 DIAGNOSIS — Z87440 Personal history of urinary (tract) infections: Secondary | ICD-10-CM | POA: Diagnosis not present

## 2019-05-06 DIAGNOSIS — R309 Painful micturition, unspecified: Secondary | ICD-10-CM

## 2019-05-06 LAB — POCT URINALYSIS DIPSTICK
Bilirubin, UA: NEGATIVE
Glucose, UA: NEGATIVE
Ketones, UA: NEGATIVE
Leukocytes, UA: NEGATIVE
Nitrite, UA: NEGATIVE
Protein, UA: POSITIVE — AB
Spec Grav, UA: 1.02 (ref 1.010–1.025)
Urobilinogen, UA: 0.2 E.U./dL
pH, UA: 6.5 (ref 5.0–8.0)

## 2019-05-06 MED ORDER — CIPROFLOXACIN HCL 500 MG PO TABS: 500.0000 mg | ORAL_TABLET | Freq: Two times a day (BID) | ORAL | 0 refills | Status: DC

## 2019-05-06 MED ORDER — NITROFURANTOIN MONOHYD MACRO 100 MG PO CAPS: 100.0000 mg | ORAL_CAPSULE | Freq: Two times a day (BID) | ORAL | 1 refills | Status: DC

## 2019-05-06 NOTE — Progress Notes (Addendum)
   GYNECOLOGY CLINIC PROGRESS NOTE Subjective:    Brooke Koch is a 33 y.o. G49P1001 female who complains of burning with urination and hematuria, and frequency for several days.  Patient denies back pain, fever and vaginal discharge.  Patient does have a history of recurrent UTI (approximately 3-4 per year).  Patient does not have a history of pyelonephritis.  Patient's last menstrual period was 04/07/2019 (exact date).    The following portions of the patient's history were reviewed and updated as appropriate: allergies, current medications, past family history, past medical history, past social history, past surgical history and problem list. Review of Systems Pertinent items noted in HPI and remainder of comprehensive ROS otherwise negative.    Objective:    BP 135/84   Pulse 68   Ht 5\' 4"  (1.626 m)   Wt 145 lb 1.6 oz (65.8 kg)   LMP 04/07/2019 (Exact Date)   BMI 24.91 kg/m  General: alert and no distress  Abdomen: soft, non-tender, without masses or organomegaly   Back: CVA tenderness absent  GU: defer exam   Laboratory:  Urine dipstick shows negative for glucose, 2+ for hemoglobin, negative for ketones, negative for leukocyte esterase, negative for nitrites and trace for protein.   Micro exam: not done.    Assessment:    Acute cystitis    Plan: Plan:    1. Medications: nitrofurantoin.  Urine culture ordered. 2. Maintain adequate hydration 3. Follow up if symptoms not improving, and prn.   4. Discussed hygiene to help with recurring UTI's (including alternating dry tissue with wet wipes), voiding before and after intercourse. If patient continues with recurrent UTI's will refer to Urology for further evaluation.    Hildred Laser, MD [facility]

## 2019-05-06 NOTE — Patient Instructions (Signed)

## 2019-05-06 NOTE — Progress Notes (Signed)
Pt is present today due to having pain with urination along with blood show. Pt stated that she noticed the pain/blood it yesterday but has not noticed the blood today. UA completed.

## 2019-08-03 ENCOUNTER — Encounter: Payer: Self-pay | Admitting: Obstetrics and Gynecology

## 2019-08-03 ENCOUNTER — Ambulatory Visit (INDEPENDENT_AMBULATORY_CARE_PROVIDER_SITE_OTHER): Payer: Managed Care, Other (non HMO) | Admitting: Obstetrics and Gynecology

## 2019-08-03 ENCOUNTER — Other Ambulatory Visit: Payer: Self-pay

## 2019-08-03 VITALS — BP 145/95 | HR 79 | Ht 64.0 in | Wt 144.6 lb

## 2019-08-03 DIAGNOSIS — Z01419 Encounter for gynecological examination (general) (routine) without abnormal findings: Secondary | ICD-10-CM | POA: Diagnosis not present

## 2019-08-03 DIAGNOSIS — Z131 Encounter for screening for diabetes mellitus: Secondary | ICD-10-CM

## 2019-08-03 DIAGNOSIS — N912 Amenorrhea, unspecified: Secondary | ICD-10-CM

## 2019-08-03 DIAGNOSIS — Z13220 Encounter for screening for lipoid disorders: Secondary | ICD-10-CM

## 2019-08-03 NOTE — Progress Notes (Signed)
GYNECOLOGY ANNUAL PHYSICAL EXAM PROGRESS NOTE  Subjective:    Brooke Koch is a 33 y.o. [id number] female who presents to establish care, and for an annual exam.  The patient is sexually active.  The patient wears seatbelts: yes. The patient participates in regular exercise: yes. Has the patient ever been transfused or tattooed?: no. The patient reports that there is not domestic violence in her life.     The patient has the following complaints today:  1. Notes last normal period was in June.  Has only had spotting in July and August without a cycle. Also recently changed OCPs to a higher dose due to breakthrough bleeding. Denies possibility of pregnancy.    Gynecologic History Menarche age: [age] Patient's last menstrual period was 05/24/2019.  Menstrual cycles are regular.  Contraception: OCPs (combined) History of STI's: Denies Last Pap: 02/2017. Results were: normal.  Denies h/o abnormal pap smears.   OB History  Gravida Para Term Preterm AB Living  1 1 1  0 0 1  SAB TAB Ectopic Multiple Live Births  0 0 0 0 1    # Outcome Date GA Lbr Len/2nd Weight Sex Delivery Anes PTL Lv  1 Term 06/09/11 [id number]  5 lb 10 oz (2.551 kg) F Vag-Spont   LIV     Birth Comments: Cleft Hand     Past Medical History:  Diagnosis Date  . Gastritis   . History of Helicobacter pylori infection     Past Surgical History:  Procedure Laterality Date  . ESOPHAGOGASTRODUODENOSCOPY  12/2009    Family History  Problem Relation Age of Onset  . Hypertension Mother   . Glaucoma Mother   . Hyperlipidemia Mother   . Hypertension Father   . Diabetes Father   . Prostate cancer Father        cancer free x 1 year    Social History   Socioeconomic History  . Marital status: Single    Spouse name: Not on file  . Number of children: Not on file  . Years of education: Not on file  . Highest education level: Not on file  Occupational History  . Not on file  Social Needs  . Financial resource  strain: Not on file  . Food insecurity    Worry: Not on file    Inability: Not on file  . Transportation needs    Medical: Not on file    Non-medical: Not on file  Tobacco Use  . Smoking status: Never Smoker  . Smokeless tobacco: Never Used  Substance and Sexual Activity  . Alcohol use: Yes    Alcohol/week: 0.0 standard drinks    Comment: social drinker  . Drug use: No  . Sexual activity: Yes    Birth control/protection: Pill  Lifestyle  . Physical activity    Days per week: Not on file    Minutes per session: Not on file  . Stress: Not on file  Relationships  . Social Musicianconnections    Talks on phone: Not on file    Gets together: Not on file    Attends religious service: Not on file    Active member of club or organization: Not on file    Attends meetings of clubs or organizations: Not on file    Relationship status: Not on file  . Intimate partner violence    Fear of current or ex partner: Not on file    Emotionally abused: Not on file    Physically abused: Not  on file    Forced sexual activity: Not on file  Other Topics Concern  . Not on file  Social History Narrative  . Not on file    Current Outpatient Medications on File Prior to Visit  Medication Sig Dispense Refill  . ACZONE 7.5 % GEL Apply 1 application topically daily. To face  1  . norethindrone-ethinyl estradiol-iron (MICROGESTIN FE,GILDESS FE,LOESTRIN FE) 1.5-30 MG-MCG tablet Take 1 tablet by mouth daily. 3 Package 4  . VELTIN gel Apply 1 application topically every evening.  1   No current facility-administered medications on file prior to visit.     Allergies  Allergen Reactions  . Septra [Sulfamethoxazole-Trimethoprim] Hives      Review of Systems Constitutional: negative for chills, fatigue, fevers and sweats Eyes: negative for irritation, redness and visual disturbance Ears, nose, mouth, throat, and face: negative for hearing loss, nasal congestion, snoring and tinnitus Respiratory:  negative for asthma, cough, sputum Cardiovascular: negative for chest pain, dyspnea, exertional chest pressure/discomfort, irregular heart beat, palpitations and syncope Gastrointestinal: negative for abdominal pain, change in bowel habits, nausea and vomiting Genitourinary: negative for abnormal menstrual periods, genital lesions, sexual problems and vaginal discharge, dysuria and urinary incontinence.  Integument/breast: negative for breast lump, breast tenderness and nipple discharge Hematologic/lymphatic: negative for bleeding and easy bruising Musculoskeletal:negative for back pain and muscle weakness Neurological: negative for dizziness, headaches, vertigo and weakness Endocrine: negative for diabetic symptoms including polydipsia, polyuria and skin dryness Allergic/Immunologic: negative for hay fever and urticaria        Objective:  Blood pressure (!) 145/95, pulse 79, height 5\' 4"  (1.626 m), weight 144 lb 9.6 oz (65.6 kg), last menstrual period 05/24/2019. Body mass index is 24.82 kg/m.  General Appearance:    Alert, cooperative, no distress, appears stated age  Head:    Normocephalic, without obvious abnormality, atraumatic  Eyes:    PERRL, conjunctiva/corneas clear, EOM's intact, both eyes  Ears:    Normal external ear canals, both ears  Nose:   Nares normal, septum midline, mucosa normal, no drainage or sinus tenderness  Throat:   Lips, mucosa, and tongue normal; teeth and gums normal  Neck:   Supple, symmetrical, trachea midline, no adenopathy; thyroid: no enlargement/tenderness/nodules; no carotid bruit or JVD  Back:     Symmetric, no curvature, ROM normal, no CVA tenderness  Lungs:     Clear to auscultation bilaterally, respirations unlabored  Chest Wall:    No tenderness or deformity   Heart:    Regular rate and rhythm, S1 and S2 normal, no murmur, rub or gallop  Breast Exam:    No tenderness, masses, or nipple abnormality  Abdomen:     Soft, non-tender, bowel sounds  active all four quadrants, no masses, no organomegaly.    Genitalia:    Pelvic:external genitalia normal, vagina without lesions, small amount of dark brown blood. Rectovaginal septum  normal. Cervix normal in appearance, no cervical motion tenderness, no adnexal masses or tenderness.  Uterus normal size, shape, mobile, regular contours, nontender.  Rectal:    Normal external sphincter.  No hemorrhoids appreciated. Internal exam not done.   Extremities:   Extremities normal, atraumatic, no cyanosis or edema  Pulses:   2+ and symmetric all extremities  Skin:   Skin color, texture, turgor normal, no rashes or lesions  Lymph nodes:   Cervical, supraclavicular, and axillary nodes normal  Neurologic:   CNII-XII intact, normal strength, sensation and reflexes throughout   .  Lab:  Lab Results  Component  Value Date   WBC 10.1 09/29/2018   HGB 13.4 09/29/2018   HCT 39.8 09/29/2018   MCV 82.6 09/29/2018   PLT 251 09/29/2018    Lab Results  Component Value Date   CREATININE 0.60 09/29/2018   BUN 6 09/29/2018   NA 140 09/29/2018   K 3.2 (L) 09/29/2018   CL 107 09/29/2018   CO2 26 09/29/2018    No results found for: CHOL, HDL, LDLCALC, LDLDIRECT, TRIG, CHOLHDL  No results found for: TSH     Assessment:   Well woman exam with routine gynecological exam  Amenorrhea due to oral contraceptive  Screening for diabetes mellitus Screening for lipid disorders   Plan:    Blood tests: CBC,  CMP, TSH, A1c, Lipid panel.  Breast self exam technique reviewed and patient encouraged to perform self-exam monthly. Contraception: OCPs.  Continue use. Discussed that amenorrhea can occur with use of OCPs. Advised on PNV and folic acid intake if she desires conception within the next year.  Discussed healthy lifestyle modifications.   Pap smear up to date. Due in 1 year.  Follow up in 1 year for annual exam.    Rubie Maid, MD [facility]

## 2019-08-03 NOTE — Patient Instructions (Signed)
Preventive Care 20-35 Years Old, Female Preventive care refers to visits with your health care provider and lifestyle choices that can promote health and wellness. This includes:  A yearly physical exam. This may also be called an annual well check.  Regular dental visits and eye exams.  Immunizations.  Screening for certain conditions.  Healthy lifestyle choices, such as eating a healthy diet, getting regular exercise, not using drugs or products that contain nicotine and tobacco, and limiting alcohol use. What can I expect for my preventive care visit? Physical exam Your health care provider will check your:  Height and weight. This may be used to calculate body mass index (BMI), which tells if you are at a healthy weight.  Heart rate and blood pressure.  Skin for abnormal spots. Counseling Your health care provider may ask you questions about your:  Alcohol, tobacco, and drug use.  Emotional well-being.  Home and relationship well-being.  Sexual activity.  Eating habits.  Work and work Statistician.  Method of birth control.  Menstrual cycle.  Pregnancy history. What immunizations do I need?  Influenza (flu) vaccine  This is recommended every year. Tetanus, diphtheria, and pertussis (Tdap) vaccine  You may need a Td booster every 10 years. Varicella (chickenpox) vaccine  You may need this if you have not been vaccinated. Human papillomavirus (HPV) vaccine  If recommended by your health care provider, you may need three doses over 6 months. Measles, mumps, and rubella (MMR) vaccine  You may need at least one dose of MMR. You may also need a second dose. Meningococcal conjugate (MenACWY) vaccine  One dose is recommended if you are age 75-21 years and a first-year college student living in a residence hall, or if you have one of several medical conditions. You may also need additional booster doses. Pneumococcal conjugate (PCV13) vaccine  You may need  this if you have certain conditions and were not previously vaccinated. Pneumococcal polysaccharide (PPSV23) vaccine  You may need one or two doses if you smoke cigarettes or if you have certain conditions. Hepatitis A vaccine  You may need this if you have certain conditions or if you travel or work in places where you may be exposed to hepatitis A. Hepatitis B vaccine  You may need this if you have certain conditions or if you travel or work in places where you may be exposed to hepatitis B. Haemophilus influenzae type b (Hib) vaccine  You may need this if you have certain conditions. You may receive vaccines as individual doses or as more than one vaccine together in one shot (combination vaccines). Talk with your health care provider about the risks and benefits of combination vaccines. What tests do I need?  Blood tests  Lipid and cholesterol levels. These may be checked every 5 years starting at age 33.  Hepatitis C test.  Hepatitis B test. Screening  Diabetes screening. This is done by checking your blood sugar (glucose) after you have not eaten for a while (fasting).  Sexually transmitted disease (STD) testing.  BRCA-related cancer screening. This may be done if you have a family history of breast, ovarian, tubal, or peritoneal cancers.  Pelvic exam and Pap test. This may be done every 3 years starting at age 76. Starting at age 102, this may be done every 5 years if you have a Pap test in combination with an HPV test. Talk with your health care provider about your test results, treatment options, and if necessary, the need for more tests.  Follow these instructions at home: °Eating and drinking ° °· Eat a diet that includes fresh fruits and vegetables, whole grains, lean protein, and low-fat dairy. °· Take vitamin and mineral supplements as recommended by your health care provider. °· Do not drink alcohol if: °? Your health care provider tells you not to drink. °? You are  pregnant, may be pregnant, or are planning to become pregnant. °· If you drink alcohol: °? Limit how much you have to 0-1 drink a day. °? Be aware of how much alcohol is in your drink. In the [place] one drink equals one 12 oz bottle of beer (355 mL), one 5 oz glass of wine (148 mL), or one 1½ oz glass of hard liquor (44 mL). °Lifestyle °· Take daily care of your teeth and gums. °· Stay active. Exercise for at least 30 minutes on 5 or more days each week. °· Do not use any products that contain nicotine or tobacco, such as cigarettes, e-cigarettes, and chewing tobacco. If you need help quitting, ask your health care provider. °· If you are sexually active, practice safe sex. Use a condom or other form of birth control (contraception) in order to prevent pregnancy and STIs (sexually transmitted infections). If you plan to become pregnant, see your health care provider for a preconception visit. °What's next? °· Visit your health care provider once a year for a well check visit. °· Ask your health care provider how often you should have your eyes and teeth checked. °· Stay up to date on all vaccines. °This information is not intended to replace advice given to you by your health care provider. Make sure you discuss any questions you have with your health care provider. °Document Released: 02/03/2002 Document Revised: 08/19/2018 Document Reviewed: 08/19/2018 °Elsevier Patient Education © 2020 [employer] °Breast Self-Awareness °Breast self-awareness is knowing how your breasts look and feel. Doing breast self-awareness is important. It allows you to catch a breast problem early while it is still small and can be treated. All women should do breast self-awareness, including women who have had breast implants. Tell your doctor if you notice a change in your breasts. °What you need: °· A mirror. °· A well-lit room. °How to do a breast self-exam °A breast self-exam is one way to learn what is normal for your breasts and to  check for changes. To do a breast self-exam: °Look for changes ° °1. Take off all the clothes above your waist. °2. Stand in front of a mirror in a room with good lighting. °3. Put your hands on your hips. °4. Push your hands down. °5. Look at your breasts and nipples in the mirror to see if one breast or nipple looks different from the other. Check to see if: °? The shape of one breast is different. °? The size of one breast is different. °? There are wrinkles, dips, and bumps in one breast and not the other. °6. Look at each breast for changes in the skin, such as: °? Redness. °? Scaly areas. °7. Look for changes in your nipples, such as: °? Liquid around the nipples. °? Bleeding. °? Dimpling. °? Redness. °? A change in where the nipples are. °Feel for changes ° °1. Lie on your back on the floor. °2. Feel each breast. To do this, follow these steps: °? Pick a breast to feel. °? Put the arm closest to that breast above your head. °? Use your other arm to feel the nipple area of   your breast. Feel the area with the pads of your three middle fingers by making small circles with your fingers. For the first circle, press lightly. For the second circle, press harder. For the third circle, press even harder. °? Keep making circles with your fingers at the different pressures as you move down your breast. Stop when you feel your ribs. °? Move your fingers a little toward the center of your body. °? Start making circles with your fingers again, this time going up until you reach your collarbone. °? Keep making up-and-down circles until you reach your armpit. Remember to keep using the three pressures. °? Feel the other breast in the same way. °3. Sit or stand in the tub or shower. °4. With soapy water on your skin, feel each breast the same way you did in step 2 when you were lying on the floor. °Write down what you find °Writing down what you find can help you remember what to tell your doctor. Write down: °· What is  normal for each breast. °· Any changes you find in each breast, including: °? The kind of changes you find. °? Whether you have pain. °? Size and location of any lumps. °· When you last had your menstrual period. °General tips °· Check your breasts every month. °· If you are breastfeeding, the best time to check your breasts is after you feed your baby or after you use a breast pump. °· If you get menstrual periods, the best time to check your breasts is 5-7 days after your menstrual period is over. °· With time, you will become comfortable with the self-exam, and you will begin to know if there are changes in your breasts. °Contact a doctor if you: °· See a change in the shape or size of your breasts or nipples. °· See a change in the skin of your breast or nipples, such as red or scaly skin. °· Have fluid coming from your nipples that is not normal. °· Find a lump or thick area that was not there before. °· Have pain in your breasts. °· Have any concerns about your breast health. °Summary °· Breast self-awareness includes looking for changes in your breasts, as well as feeling for changes within your breasts. °· Breast self-awareness should be done in front of a mirror in a well-lit room. °· You should check your breasts every month. If you get menstrual periods, the best time to check your breasts is 5-7 days after your menstrual period is over. °· Let your doctor know of any changes you see in your breasts, including changes in size, changes on the skin, pain or tenderness, or fluid from your nipples that is not normal. °This information is not intended to replace advice given to you by your health care provider. Make sure you discuss any questions you have with your health care provider. °Document Released: 05/26/2008 Document Revised: 07/27/2018 Document Reviewed: 07/27/2018 °Elsevier Patient Education © 2020 [employer] ° °

## 2019-08-03 NOTE — Progress Notes (Signed)
Pt is present for annual exam. Pt stated that she doing well no problems.

## 2019-08-10 ENCOUNTER — Other Ambulatory Visit: Payer: Self-pay

## 2019-08-10 ENCOUNTER — Other Ambulatory Visit: Payer: Managed Care, Other (non HMO)

## 2019-08-11 LAB — LIPID PANEL
Chol/HDL Ratio: 4.8 ratio — ABNORMAL HIGH (ref 0.0–4.4)
Cholesterol, Total: 236 mg/dL — ABNORMAL HIGH (ref 100–199)
HDL: 49 mg/dL (ref >39)
LDL Calculated: 155 mg/dL — ABNORMAL HIGH (ref 0–99)
Triglycerides: 162 mg/dL — ABNORMAL HIGH (ref 0–149)
VLDL Cholesterol Cal: 32 mg/dL (ref 5–40)

## 2019-08-11 LAB — COMPREHENSIVE METABOLIC PANEL
ALT: 13 IU/L (ref 0–32)
AST: 22 IU/L (ref 0–40)
Albumin/Globulin Ratio: 2.1 (ref 1.2–2.2)
Albumin: 4.6 g/dL (ref 3.8–4.8)
Alkaline Phosphatase: 37 IU/L — ABNORMAL LOW (ref 39–117)
BUN/Creatinine Ratio: 10 (ref 9–23)
BUN: 7 mg/dL (ref 6–20)
Bilirubin Total: 0.3 mg/dL (ref 0.0–1.2)
CO2: 24 mmol/L (ref 20–29)
Calcium: 9.1 mg/dL (ref 8.7–10.2)
Chloride: 102 mmol/L (ref 96–106)
Creatinine, Ser: 0.69 mg/dL (ref 0.57–1.00)
GFR calc Af Amer: 130 mL/min/{1.73_m2} (ref >59)
GFR calc non Af Amer: 113 mL/min/{1.73_m2} (ref >59)
Globulin, Total: 2.2 g/dL (ref 1.5–4.5)
Glucose: 81 mg/dL (ref 65–99)
Potassium: 4.5 mmol/L (ref 3.5–5.2)
Sodium: 140 mmol/L (ref 134–144)
Total Protein: 6.8 g/dL (ref 6.0–8.5)

## 2019-08-11 LAB — CBC
Hematocrit: 41 % (ref 34.0–46.6)
Hemoglobin: 13.7 g/dL (ref 11.1–15.9)
MCH: 27.2 pg (ref 26.6–33.0)
MCHC: 33.4 g/dL (ref 31.5–35.7)
MCV: 81 fL (ref 79–97)
Platelets: 322 10*3/uL (ref 150–450)
RBC: 5.04 x10E6/uL (ref 3.77–5.28)
RDW: 12.8 % (ref 11.7–15.4)
WBC: 4.4 10*3/uL (ref 3.4–10.8)

## 2019-08-11 LAB — TSH: TSH: 2.19 u[IU]/mL (ref 0.450–4.500)

## 2019-11-17 ENCOUNTER — Other Ambulatory Visit: Payer: Self-pay | Admitting: Obstetrics and Gynecology

## 2019-11-17 DIAGNOSIS — Z30011 Encounter for initial prescription of contraceptive pills: Secondary | ICD-10-CM

## 2020-02-12 ENCOUNTER — Other Ambulatory Visit: Payer: Self-pay | Admitting: Obstetrics and Gynecology

## 2020-07-19 ENCOUNTER — Other Ambulatory Visit: Payer: Self-pay | Admitting: Obstetrics and Gynecology

## 2020-08-03 ENCOUNTER — Other Ambulatory Visit (HOSPITAL_COMMUNITY)
Admission: RE | Admit: 2020-08-03 | Discharge: 2020-08-03 | Disposition: A | Discharge status: Home / Self Care | Payer: Managed Care, Other (non HMO) | Source: Ambulatory Visit | Attending: Obstetrics and Gynecology | Admitting: Obstetrics and Gynecology

## 2020-08-03 ENCOUNTER — Other Ambulatory Visit: Payer: Self-pay

## 2020-08-03 ENCOUNTER — Encounter: Payer: Self-pay | Admitting: Obstetrics and Gynecology

## 2020-08-03 ENCOUNTER — Ambulatory Visit (INDEPENDENT_AMBULATORY_CARE_PROVIDER_SITE_OTHER): Payer: Managed Care, Other (non HMO) | Admitting: Obstetrics and Gynecology

## 2020-08-03 VITALS — BP 141/86 | HR 77 | Ht 64.0 in | Wt 148.1 lb

## 2020-08-03 DIAGNOSIS — Z131 Encounter for screening for diabetes mellitus: Secondary | ICD-10-CM | POA: Diagnosis not present

## 2020-08-03 DIAGNOSIS — N921 Excessive and frequent menstruation with irregular cycle: Secondary | ICD-10-CM

## 2020-08-03 DIAGNOSIS — R194 Change in bowel habit: Secondary | ICD-10-CM

## 2020-08-03 DIAGNOSIS — Z124 Encounter for screening for malignant neoplasm of cervix: Secondary | ICD-10-CM | POA: Insufficient documentation

## 2020-08-03 DIAGNOSIS — Z01419 Encounter for gynecological examination (general) (routine) without abnormal findings: Secondary | ICD-10-CM | POA: Diagnosis not present

## 2020-08-03 DIAGNOSIS — R030 Elevated blood-pressure reading, without diagnosis of hypertension: Secondary | ICD-10-CM

## 2020-08-03 DIAGNOSIS — E785 Hyperlipidemia, unspecified: Secondary | ICD-10-CM

## 2020-08-03 MED ORDER — NORETHIN ACE-ETH ESTRAD-FE 1.5-30 MG-MCG PO TABS: 1.0000 | ORAL_TABLET | Freq: Every day | ORAL | 4 refills | Status: DC

## 2020-08-03 NOTE — Patient Instructions (Addendum)
Preventive Care 21-36 Years Old, Female Preventive care refers to visits with your health care provider and lifestyle choices that can promote health and wellness. This includes:  A yearly physical exam. This may also be called an annual well check.  Regular dental visits and eye exams.  Immunizations.  Screening for certain conditions.  Healthy lifestyle choices, such as eating a healthy diet, getting regular exercise, not using drugs or products that contain nicotine and tobacco, and limiting alcohol use. What can I expect for my preventive care visit? Physical exam Your health care provider will check your:  Height and weight. This may be used to calculate body mass index (BMI), which tells if you are at a healthy weight.  Heart rate and blood pressure.  Skin for abnormal spots. Counseling Your health care provider may ask you questions about your:  Alcohol, tobacco, and drug use.  Emotional well-being.  Home and relationship well-being.  Sexual activity.  Eating habits.  Work and work environment.  Method of birth control.  Menstrual cycle.  Pregnancy history. What immunizations do I need?  Influenza (flu) vaccine  This is recommended every year. Tetanus, diphtheria, and pertussis (Tdap) vaccine  You may need a Td booster every 10 years. Varicella (chickenpox) vaccine  You may need this if you have not been vaccinated. Human papillomavirus (HPV) vaccine  If recommended by your health care provider, you may need three doses over 6 months. Measles, mumps, and rubella (MMR) vaccine  You may need at least one dose of MMR. You may also need a second dose. Meningococcal conjugate (MenACWY) vaccine  One dose is recommended if you are age 19-21 years and a first-year college student living in a residence hall, or if you have one of several medical conditions. You may also need additional booster doses. Pneumococcal conjugate (PCV13) vaccine  You may need  this if you have certain conditions and were not previously vaccinated. Pneumococcal polysaccharide (PPSV23) vaccine  You may need one or two doses if you smoke cigarettes or if you have certain conditions. Hepatitis A vaccine  You may need this if you have certain conditions or if you travel or work in places where you may be exposed to hepatitis A. Hepatitis B vaccine  You may need this if you have certain conditions or if you travel or work in places where you may be exposed to hepatitis B. Haemophilus influenzae type b (Hib) vaccine  You may need this if you have certain conditions. You may receive vaccines as individual doses or as more than one vaccine together in one shot (combination vaccines). Talk with your health care provider about the risks and benefits of combination vaccines. What tests do I need?  Blood tests  Lipid and cholesterol levels. These may be checked every 5 years starting at age 20.  Hepatitis C test.  Hepatitis B test. Screening  Diabetes screening. This is done by checking your blood sugar (glucose) after you have not eaten for a while (fasting).  Sexually transmitted disease (STD) testing.  BRCA-related cancer screening. This may be done if you have a family history of breast, ovarian, tubal, or peritoneal cancers.  Pelvic exam and Pap test. This may be done every 3 years starting at age 21. Starting at age 30, this may be done every 5 years if you have a Pap test in combination with an HPV test. Talk with your health care provider about your test results, treatment options, and if necessary, the need for more tests.   Follow these instructions at home: Eating and drinking   Eat a diet that includes fresh fruits and vegetables, whole grains, lean protein, and low-fat dairy.  Take vitamin and mineral supplements as recommended by your health care provider.  Do not drink alcohol if: ? Your health care provider tells you not to drink. ? You are  pregnant, may be pregnant, or are planning to become pregnant.  If you drink alcohol: ? Limit how much you have to 0-1 drink a day. ? Be aware of how much alcohol is in your drink. In the [place] one drink equals one 12 oz bottle of beer (355 mL), one 5 oz glass of wine (148 mL), or one 1 oz glass of hard liquor (44 mL). Lifestyle  Take daily care of your teeth and gums.  Stay active. Exercise for at least 30 minutes on 5 or more days each week.  Do not use any products that contain nicotine or tobacco, such as cigarettes, e-cigarettes, and chewing tobacco. If you need help quitting, ask your health care provider.  If you are sexually active, practice safe sex. Use a condom or other form of birth control (contraception) in order to prevent pregnancy and STIs (sexually transmitted infections). If you plan to become pregnant, see your health care provider for a preconception visit. What's next?  Visit your health care provider once a year for a well check visit.  Ask your health care provider how often you should have your eyes and teeth checked.  Stay up to date on all vaccines. This information is not intended to replace advice given to you by your health care provider. Make sure you discuss any questions you have with your health care provider. Document Revised: 08/19/2018 Document Reviewed: 08/19/2018 Elsevier Patient Education  2020 Elsevier Inc. Breast Self-Awareness Breast self-awareness is knowing how your breasts look and feel. Doing breast self-awareness is important. It allows you to catch a breast problem early while it is still small and can be treated. All women should do breast self-awareness, including women who have had breast implants. Tell your doctor if you notice a change in your breasts. What you need:  A mirror.  A well-lit room. How to do a breast self-exam A breast self-exam is one way to learn what is normal for your breasts and to check for changes. To do a  breast self-exam: Look for changes  1. Take off all the clothes above your waist. 2. Stand in front of a mirror in a room with good lighting. 3. Put your hands on your hips. 4. Push your hands down. 5. Look at your breasts and nipples in the mirror to see if one breast or nipple looks different from the other. Check to see if: ? The shape of one breast is different. ? The size of one breast is different. ? There are wrinkles, dips, and bumps in one breast and not the other. 6. Look at each breast for changes in the skin, such as: ? Redness. ? Scaly areas. 7. Look for changes in your nipples, such as: ? Liquid around the nipples. ? Bleeding. ? Dimpling. ? Redness. ? A change in where the nipples are. Feel for changes  1. Lie on your back on the floor. 2. Feel each breast. To do this, follow these steps: ? Pick a breast to feel. ? Put the arm closest to that breast above your head. ? Use your other arm to feel the nipple area of your breast. Feel   breast. Feel the area with the pads of your three middle fingers by making small circles with your fingers. For the first circle, press lightly. For the second circle, press harder. For the third circle, press even harder. ? Keep making circles with your fingers at the different pressures as you move down your breast. Stop when you feel your ribs. ? Move your fingers a little toward the center of your body. ? Start making circles with your fingers again, this time going up until you reach your collarbone. ? Keep making up-and-down circles until you reach your armpit. Remember to keep using the three pressures. ? Feel the other breast in the same way. 3. Sit or stand in the tub or shower. 4. With soapy water on your skin, feel each breast the same way you did in step 2 when you were lying on the floor. Write down what you find Writing down what you find can help you remember what to tell your doctor. Write down:  What is normal for each breast.  Any  changes you find in each breast, including: ? The kind of changes you find. ? Whether you have pain. ? Size and location of any lumps.  When you last had your menstrual period. General tips  Check your breasts every month.  If you are breastfeeding, the best time to check your breasts is after you feed your baby or after you use a breast pump.  If you get menstrual periods, the best time to check your breasts is 5-7 days after your menstrual period is over.  With time, you will become comfortable with the self-exam, and you will begin to know if there are changes in your breasts. Contact a doctor if you:  See a change in the shape or size of your breasts or nipples.  See a change in the skin of your breast or nipples, such as red or scaly skin.  Have fluid coming from your nipples that is not normal.  Find a lump or thick area that was not there before.  Have pain in your breasts.  Have any concerns about your breast health. Summary  Breast self-awareness includes looking for changes in your breasts, as well as feeling for changes within your breasts.  Breast self-awareness should be done in front of a mirror in a well-lit room.  You should check your breasts every month. If you get menstrual periods, the best time to check your breasts is 5-7 days after your menstrual period is over.  Let your doctor know of any changes you see in your breasts, including changes in size, changes on the skin, pain or tenderness, or fluid from your nipples that is not normal. This information is not intended to replace advice given to you by your health care provider. Make sure you discuss any questions you have with your health care provider. Document Revised: 07/27/2018 Document Reviewed: 07/27/2018 Elsevier Patient Education  [place].

## 2020-08-03 NOTE — Progress Notes (Signed)
Pt present for annual exam. Pt stated that she was doing well no problems.  

## 2020-08-03 NOTE — Progress Notes (Signed)
GYNECOLOGY ANNUAL PHYSICAL EXAM PROGRESS NOTE  Subjective:    Brooke Koch is a 34 y.o. G3P1001 female who presents for an annual exam.  The patient is sexually active.  The patient wears seatbelts: yes. The patient participates in regular exercise: yes. Has the patient ever been transfused or tattooed?: no. The patient reports that there is not domestic violence in her life.     The patient has the following complaints today:  1. Intermittent abdominal pain that has been waking her up out of her sleep the past 6 months. Inconsistent bowel movements, alternating between diarrhea and constipation. 3. Irregular periods, possible breakthrough bleeding with OCPs, but she doesn't want to make any changes to medications at this time.   Gynecologic History Menarche age: 61 Patient's last menstrual period was 07/16/2020.  Menstrual cycles are regular.  History of STI's: Denies Last Pap: 02/2017. Results were: normal.  Denies h/o abnormal pap smears. Contraception: OCPs (combined).   Upstream - 08/03/20 6606      Pregnancy Intention Screening   Does the patient want to become pregnant in the next year? No    Does the patient's partner want to become pregnant in the next year? No    Would the patient like to discuss contraceptive options today? No      Contraception Wrap Up   Current Method Oral Contraceptive          The pregnancy intention screening data noted above was reviewed. Potential methods of contraception were discussed. The patient elected to continue with Oral Contraceptive.    OB History  Gravida Para Term Preterm AB Living  1 1 1  0 0 1  SAB TAB Ectopic Multiple Live Births  0 0 0 0 1    # Outcome Date GA Lbr Len/2nd Weight Sex Delivery Anes PTL Lv  1 Term 06/09/11 [redacted]w[redacted]d  5 lb 10 oz (2.551 kg) F Vag-Spont   LIV     Birth Comments: Cleft Hand     Past Medical History:  Diagnosis Date  . Gastritis   . History of Helicobacter pylori infection     Past  Surgical History:  Procedure Laterality Date  . ESOPHAGOGASTRODUODENOSCOPY  12/2009    Family History  Problem Relation Age of Onset  . Hypertension Mother   . Glaucoma Mother   . Hyperlipidemia Mother   . Hypertension Father   . Diabetes Father   . Prostate cancer Father        cancer free x 1 year    Social History   Socioeconomic History  . Marital status: Single    Spouse name: Not on file  . Number of children: Not on file  . Years of education: Not on file  . Highest education level: Not on file  Occupational History  . Not on file  Tobacco Use  . Smoking status: Never Smoker  . Smokeless tobacco: Never Used  Vaping Use  . Vaping Use: Never used  Substance and Sexual Activity  . Alcohol use: Yes    Alcohol/week: 0.0 standard drinks    Comment: social drinker  . Drug use: No  . Sexual activity: Yes    Birth control/protection: Pill  Other Topics Concern  . Not on file  Social History Narrative  . Not on file   Social Determinants of Health   Financial Resource Strain:   . Difficulty of Paying Living Expenses:   Food Insecurity:   . Worried About 01/2010 in the Last  Year:   . Ran Out of Food in the Last Year:   Transportation Needs:   . Freight forwarder (Medical):   Marland Kitchen Lack of Transportation (Non-Medical):   Physical Activity:   . Days of Exercise per Week:   . Minutes of Exercise per Session:   Stress:   . Feeling of Stress :   Social Connections:   . Frequency of Communication with Friends and Family:   . Frequency of Social Gatherings with Friends and Family:   . Attends Religious Services:   . Active Member of Clubs or Organizations:   . Attends Banker Meetings:   Marland Kitchen Marital Status:   Intimate Partner Violence:   . Fear of Current or Ex-Partner:   . Emotionally Abused:   Marland Kitchen Physically Abused:   . Sexually Abused:     Current Outpatient Medications on File Prior to Visit  Medication Sig Dispense Refill  .  ACZONE 7.5 % GEL Apply 1 application topically daily. To face  1  . JUNEL FE 1.5/30 1.5-30 MG-MCG tablet TAKE 1 TABLET BY MOUTH EVERY DAY 84 tablet 2  . VELTIN gel Apply 1 application topically every evening. (Patient not taking: Reported on 08/03/2020)  1   No current facility-administered medications on file prior to visit.    Allergies  Allergen Reactions  . Septra [Sulfamethoxazole-Trimethoprim] Hives      Review of Systems Constitutional: negative for chills, fatigue, fevers and sweats Eyes: negative for irritation, redness and visual disturbance Ears, nose, mouth, throat, and face: negative for hearing loss, nasal congestion, snoring and tinnitus Respiratory: negative for asthma, cough, sputum Cardiovascular: negative for chest pain, dyspnea, exertional chest pressure/discomfort, irregular heart beat, palpitations and syncope Gastrointestinal: positive for changes in bowel habits and intermittent abdominal pain; negative for nausea and vomiting, blood in stool Genitourinary: negative for abnormal menstrual periods (positive for breakthrough bleeding on OCPs), genital lesions, sexual problems and vaginal discharge, dysuria and urinary incontinence.  Integument/breast: negative for breast lump, breast tenderness and nipple discharge Hematologic/lymphatic: negative for bleeding and easy bruising Musculoskeletal:negative for back pain and muscle weakness Neurological: negative for dizziness, headaches, vertigo and weakness Endocrine: negative for diabetic symptoms including polydipsia, polyuria and skin dryness Allergic/Immunologic: negative for hay fever and urticaria        Objective:  Blood pressure (!) 141/86, pulse 77, height 5\' 4"  (1.626 m), weight 148 lb 1.6 oz (67.2 kg), last menstrual period 07/16/2020. Body mass index is 25.42 kg/m. Repeat BP 149/97.   General Appearance:    Alert, cooperative, no distress, appears stated age  Head:    Normocephalic, without obvious  abnormality, atraumatic  Eyes:    PERRL, conjunctiva/corneas clear, EOM's intact, both eyes  Ears:    Normal external ear canals, both ears  Nose:   Nares normal, septum midline, mucosa normal, no drainage or sinus tenderness  Throat:   Lips, mucosa, and tongue normal; teeth and gums normal  Neck:   Supple, symmetrical, trachea midline, no adenopathy; thyroid: no enlargement/tenderness/nodules; no carotid bruit or JVD  Back:     Symmetric, no curvature, ROM normal, no CVA tenderness  Lungs:     Clear to auscultation bilaterally, respirations unlabored  Chest Wall:    No tenderness or deformity   Heart:    Regular rate and rhythm, S1 and S2 normal, no murmur, rub or gallop  Breast Exam:    No tenderness, masses, or nipple abnormality  Abdomen:     Soft, non-tender, bowel sounds active all four  quadrants, no masses, no organomegaly.    Genitalia:    Pelvic:external genitalia normal, vagina without lesions, small amount of dark brown blood. Rectovaginal septum  normal. Cervix normal in appearance, no cervical motion tenderness, no adnexal masses or tenderness.  Uterus normal size, shape, mobile, regular contours, nontender.  Rectal:    Normal external sphincter.  No hemorrhoids appreciated. Internal exam not done.   Extremities:   Extremities normal, atraumatic, no cyanosis or edema  Pulses:   2+ and symmetric all extremities  Skin:   Skin color, texture, turgor normal, no rashes or lesions  Lymph nodes:   Cervical, supraclavicular, and axillary nodes normal  Neurologic:   CNII-XII intact, normal strength, sensation and reflexes throughout   .  Lab:  Lab Results  Component Value Date   WBC 4.4 08/10/2019   HGB 13.7 08/10/2019   HCT 41.0 08/10/2019   MCV 81 08/10/2019   PLT 322 08/10/2019    Lab Results  Component Value Date   CREATININE 0.69 08/10/2019   BUN 7 08/10/2019   NA 140 08/10/2019   K 4.5 08/10/2019   CL 102 08/10/2019   CO2 24 08/10/2019    Lab Results  Component  Value Date   CHOL 236 (H) 08/10/2019   HDL 49 08/10/2019   LDLCALC 155 (H) 08/10/2019   TRIG 162 (H) 08/10/2019   CHOLHDL 4.8 (H) 08/10/2019    Lab Results  Component Value Date   TSH 2.190 08/10/2019     Assessment:   1. Encounter for well woman exam with routine gynecological exam   2. Screening for diabetes mellitus   3. Dyslipidemia   4. Breakthrough bleeding on OCPs   5. Change in bowel habits   6. Cervical cancer screening   7. Elevated blood pressure reading in office without diagnosis of hypertension     Plan:    Blood tests: CBC, CMP, A1c, Lipid panel.  Breast self exam technique reviewed and patient encouraged to perform self-exam monthly. Contraception: OCPs.  Continue use. Discussed that amenorrhea can occur with use of OCPs. Advised on PNV and folic acid intake if she desires conception within the next year.  Discussed healthy lifestyle modifications.   Pap smear completed; Due in 3 years.  Referral to GI made for bowel changes.  Elevated BP noted today, no prior history of HTN.  Will continue to monitor, if continues to have elevated BPs and diagnosed with HTN, may need to consider changing birth control to one that does not contain estrogen as she is over age 75.  COVID vaccine: patient has completed series.  Follow up in 1 year for annual exam.   I have seen and examined the patient with April Greissenger, [place] PA-S.  I have reviewed the record and concur with patient management and plan.    Hildred Laser, MD [facility]

## 2020-08-04 LAB — COMPREHENSIVE METABOLIC PANEL
ALT: 19 IU/L (ref 0–32)
AST: 22 IU/L (ref 0–40)
Albumin/Globulin Ratio: 1.8 (ref 1.2–2.2)
Albumin: 4.4 g/dL (ref 3.8–4.8)
Alkaline Phosphatase: 49 IU/L (ref 48–121)
BUN/Creatinine Ratio: 20 (ref 9–23)
BUN: 11 mg/dL (ref 6–20)
Bilirubin Total: 0.3 mg/dL (ref 0.0–1.2)
CO2: 25 mmol/L (ref 20–29)
Calcium: 8.9 mg/dL (ref 8.7–10.2)
Chloride: 102 mmol/L (ref 96–106)
Creatinine, Ser: 0.56 mg/dL — ABNORMAL LOW (ref 0.57–1.00)
GFR calc Af Amer: 139 mL/min/{1.73_m2} (ref >59)
GFR calc non Af Amer: 120 mL/min/{1.73_m2} (ref >59)
Globulin, Total: 2.4 g/dL (ref 1.5–4.5)
Glucose: 85 mg/dL (ref 65–99)
Potassium: 4.1 mmol/L (ref 3.5–5.2)
Sodium: 140 mmol/L (ref 134–144)
Total Protein: 6.8 g/dL (ref 6.0–8.5)

## 2020-08-04 LAB — CBC
Hematocrit: 41.3 % (ref 34.0–46.6)
Hemoglobin: 13.2 g/dL (ref 11.1–15.9)
MCH: 26.6 pg (ref 26.6–33.0)
MCHC: 32 g/dL (ref 31.5–35.7)
MCV: 83 fL (ref 79–97)
Platelets: 322 10*3/uL (ref 150–450)
RBC: 4.96 x10E6/uL (ref 3.77–5.28)
RDW: 12.5 % (ref 11.7–15.4)
WBC: 4.7 10*3/uL (ref 3.4–10.8)

## 2020-08-04 LAB — LIPID PANEL
Chol/HDL Ratio: 5.4 ratio — ABNORMAL HIGH (ref 0.0–4.4)
Cholesterol, Total: 266 mg/dL — ABNORMAL HIGH (ref 100–199)
HDL: 49 mg/dL (ref >39)
LDL Chol Calc (NIH): 158 mg/dL — ABNORMAL HIGH (ref 0–99)
Triglycerides: 314 mg/dL — ABNORMAL HIGH (ref 0–149)
VLDL Cholesterol Cal: 59 mg/dL — ABNORMAL HIGH (ref 5–40)

## 2020-08-04 LAB — HEMOGLOBIN A1C
Est. average glucose Bld gHb Est-mCnc: 100 mg/dL
Hgb A1c MFr Bld: 5.1 % (ref 4.8–5.6)

## 2020-08-08 LAB — CYTOLOGY - PAP
Comment: NEGATIVE
Diagnosis: NEGATIVE
High risk HPV: NEGATIVE

## 2020-10-17 ENCOUNTER — Other Ambulatory Visit: Payer: Self-pay

## 2020-10-17 ENCOUNTER — Telehealth: Payer: Self-pay

## 2020-10-17 ENCOUNTER — Ambulatory Visit (INDEPENDENT_AMBULATORY_CARE_PROVIDER_SITE_OTHER): Payer: Managed Care, Other (non HMO) | Admitting: Gastroenterology

## 2020-10-17 ENCOUNTER — Encounter: Payer: Self-pay | Admitting: Gastroenterology

## 2020-10-17 DIAGNOSIS — R194 Change in bowel habit: Secondary | ICD-10-CM | POA: Diagnosis not present

## 2020-10-17 DIAGNOSIS — R1013 Epigastric pain: Secondary | ICD-10-CM

## 2020-10-17 DIAGNOSIS — G8929 Other chronic pain: Secondary | ICD-10-CM

## 2020-10-17 DIAGNOSIS — E785 Hyperlipidemia, unspecified: Secondary | ICD-10-CM

## 2020-10-17 MED ORDER — SUPREP BOWEL PREP KIT 17.5-3.13-1.6 GM/177ML PO SOLN: 1.0000 | ORAL | 0 refills | Status: DC

## 2020-10-17 NOTE — H&P (View-Only) (Signed)
  Gastroenterology Consultation  Referring Provider:     Bland, Veita, MD Primary Care Physician:  Bland, Veita, MD Primary Gastroenterologist:  Dr. Remmy Riffe     Reason for Consultation:     Change in bowel habits        HPI:   Brooke Koch is a 34 y.o. y/o female referred for consultation & management of change in bowel habits by Dr. Bland, Veita, MD.  This patient comes in today after seeing me back in 2017 for abdominal pain.  At that time the patient had pain that did not seem to be consistent with intestinal pain but the patient did have a history of gastritis and was treated for possible gastritis.  The patient now reports that she has been having alternating diarrhea and constipation with abdominal pain.  The patient does have a history of H. pylori in the past.  In 2017 she had blood work that was negative for any H. pylori antibodies.  The patient was seen by her gynecologist who reported the patient to be woken up from the abdominal pain and had reported in August that it has been present for 6 months. The patient reports that the pain at has awaken her in the middle the night does not happen very often.  She also reports that this is a drastic change from her previous bowel movements.  There is no report of any black stools or bloody stools.  She also denies any unexplained weight loss. The patient does not know of anything that makes the symptoms any better or worse.  Her abdominal pain is in the midepigastric area and lower abdominal area.  Past Medical History:  Diagnosis Date  . Gastritis   . History of Helicobacter pylori infection     Past Surgical History:  Procedure Laterality Date  . ESOPHAGOGASTRODUODENOSCOPY  12/2009    Prior to Admission medications   Medication Sig Start Date End Date Taking? Authorizing Provider  ACZONE 7.5 % GEL Apply 1 application topically daily. To face 08/20/18   [provider]  norethindrone-ethinyl estradiol-iron (JUNEL FE 1.5/30)  1.5-30 MG-MCG tablet Take 1 tablet by mouth daily. 08/03/20   Cherry, Anika, MD  VELTIN gel Apply 1 application topically every evening. Patient not taking: Reported on 08/03/2020 08/20/18   [provider]    Family History  Problem Relation Age of Onset  . Hypertension Mother   . Glaucoma Mother   . Hyperlipidemia Mother   . Hypertension Father   . Diabetes Father   . Prostate cancer Father        cancer free x 1 year  . Breast cancer Cousin   . Breast cancer Cousin      Social History   Tobacco Use  . Smoking status: Never Smoker  . Smokeless tobacco: Never Used  Vaping Use  . Vaping Use: Never used  Substance Use Topics  . Alcohol use: Yes    Alcohol/week: 0.0 standard drinks    Comment: social drinker  . Drug use: No    Allergies as of 10/17/2020 - Review Complete 10/17/2020  Allergen Reaction Noted  . Septra [sulfamethoxazole-trimethoprim] Hives 01/03/2016    Review of Systems:    All systems reviewed and negative except where noted in HPI.   Physical Exam:  There were no vitals taken for this visit. No LMP recorded. (Menstrual status: Oral contraceptives). General:   Alert,  Well-developed, well-nourished, pleasant and cooperative in NAD Head:  Normocephalic and atraumatic. Eyes:  Sclera clear,   no icterus.   Conjunctiva pink. Ears:  Normal auditory acuity. Neck:  Supple; no masses or thyromegaly. Lungs:  Respirations even and unlabored.  Clear throughout to auscultation.   No wheezes, crackles, or rhonchi. No acute distress. Heart:  Regular rate and rhythm; no murmurs, clicks, rubs, or gallops. Abdomen:  Normal bowel sounds.  No bruits.  Soft, non-tender and non-distended without masses, hepatosplenomegaly or hernias noted.  No guarding or rebound tenderness.  Negative Carnett sign.   Rectal:  Deferred.  Pulses:  Normal pulses noted. Extremities:  No clubbing or edema.  No cyanosis. Neurologic:  Alert and oriented x3;  grossly normal  neurologically. Skin:  Intact without significant lesions or rashes.  No jaundice. Lymph Nodes:  No significant cervical adenopathy. Psych:  Alert and cooperative. Normal mood and affect.  Imaging Studies: No results found.  Assessment and Plan:   Brooke Koch is a 34 y.o. y/o female who comes in today with a history of a change in bowel habits over the last 8 months with a gradual increase of diarrhea.  The patient denies any appreciable milk intake and only has milk with her coffee.  There is no report of any black stools or bloody stools.  The patient does have the pain wake her up from the middle the night and a significant change over the last 8 months.  She denies any family history of intestinal issues.  The patient will be set up for a colonoscopy to rule out any inflammatory process as the cause of her abdominal pain with her change in bowel habits.  The patient may need to be tried on an antispasmodic if her colonoscopy is negative.  The patient has been explained the plan agrees with it.    Midge Minium, MD. Clementeen Graham    Note: This dictation was prepared with Dragon dictation along with smaller phrase technology. Any transcriptional errors that result from this process are unintentional.

## 2020-10-17 NOTE — Telephone Encounter (Signed)
Pt called no answer LM via VM that her lipid panel blood redraw had been placed for her appointment in February.

## 2020-10-17 NOTE — Telephone Encounter (Signed)
Pt had labs at her AE in 07/2020.   Lipids elevated advised by [facility] to repeat in 4-6 months. Appt made for 01/28/2021.   Pt aware to be fasting. Can the labs be ordered before appt.   She also stated she had elevated BP at her Ae and today at GI. Encouraged pt to f/u with PCP.

## 2020-10-17 NOTE — Progress Notes (Signed)
Gastroenterology Consultation  Referring Provider:     Renaye Rakers, MD Primary Care Physician:  Brooke Rakers, MD Primary Gastroenterologist:  Dr. Servando Koch     Reason for Consultation:     Change in bowel habits        HPI:   Brooke Koch is a 33 y.o. y/o female referred for consultation & management of change in bowel habits by Dr. Renaye Rakers, MD.  This patient comes in today after seeing me back in 2017 for abdominal pain.  At that time the patient had pain that did not seem to be consistent with intestinal pain but the patient did have a history of gastritis and was treated for possible gastritis.  The patient now reports that she has been having alternating diarrhea and constipation with abdominal pain.  The patient does have a history of H. pylori in the past.  In 2017 she had blood work that was negative for any H. pylori antibodies.  The patient was seen by her gynecologist who reported the patient to be woken up from the abdominal pain and had reported in August that it has been present for 6 months. The patient reports that the pain at has awaken her in the middle the night does not happen very often.  She also reports that this is a drastic change from her previous bowel movements.  There is no report of any black stools or bloody stools.  She also denies any unexplained weight loss. The patient does not know of anything that makes the symptoms any better or worse.  Her abdominal pain is in the midepigastric area and lower abdominal area.  Past Medical History:  Diagnosis Date  . Gastritis   . History of Helicobacter pylori infection     Past Surgical History:  Procedure Laterality Date  . ESOPHAGOGASTRODUODENOSCOPY  12/2009    Prior to Admission medications   Medication Sig Start Date End Date Taking? Authorizing Provider  ACZONE 7.5 % GEL Apply 1 application topically daily. To face 08/20/18   [provider]  norethindrone-ethinyl estradiol-iron (JUNEL FE 1.5/30)  1.5-30 MG-MCG tablet Take 1 tablet by mouth daily. 08/03/20   Hildred Laser, MD  VELTIN gel Apply 1 application topically every evening. Patient not taking: Reported on 08/03/2020 08/20/18   [provider]    Family History  Problem Relation Age of Onset  . Hypertension Mother   . Glaucoma Mother   . Hyperlipidemia Mother   . Hypertension Father   . Diabetes Father   . Prostate cancer Father        cancer free x 1 year  . Breast cancer Cousin   . Breast cancer Cousin      Social History   Tobacco Use  . Smoking status: Never Smoker  . Smokeless tobacco: Never Used  Vaping Use  . Vaping Use: Never used  Substance Use Topics  . Alcohol use: Yes    Alcohol/week: 0.0 standard drinks    Comment: social drinker  . Drug use: No    Allergies as of 10/17/2020 - Review Complete 10/17/2020  Allergen Reaction Noted  . Septra [sulfamethoxazole-trimethoprim] Hives 01/03/2016    Review of Systems:    All systems reviewed and negative except where noted in HPI.   Physical Exam:  There were no vitals taken for this visit. No LMP recorded. (Menstrual status: Oral contraceptives). General:   Alert,  Well-developed, well-nourished, pleasant and cooperative in NAD Head:  Normocephalic and atraumatic. Eyes:  Sclera clear,  no icterus.   Conjunctiva pink. Ears:  Normal auditory acuity. Neck:  Supple; no masses or thyromegaly. Lungs:  Respirations even and unlabored.  Clear throughout to auscultation.   No wheezes, crackles, or rhonchi. No acute distress. Heart:  Regular rate and rhythm; no murmurs, clicks, rubs, or gallops. Abdomen:  Normal bowel sounds.  No bruits.  Soft, non-tender and non-distended without masses, hepatosplenomegaly or hernias noted.  No guarding or rebound tenderness.  Negative Carnett sign.   Rectal:  Deferred.  Pulses:  Normal pulses noted. Extremities:  No clubbing or edema.  No cyanosis. Neurologic:  Alert and oriented x3;  grossly normal  neurologically. Skin:  Intact without significant lesions or rashes.  No jaundice. Lymph Nodes:  No significant cervical adenopathy. Psych:  Alert and cooperative. Normal mood and affect.  Imaging Studies: No results found.  Assessment and Plan:   Brooke Koch is a 34 y.o. y/o female who comes in today with a history of a change in bowel habits over the last 8 months with a gradual increase of diarrhea.  The patient denies any appreciable milk intake and only has milk with her coffee.  There is no report of any black stools or bloody stools.  The patient does have the pain wake her up from the middle the night and a significant change over the last 8 months.  She denies any family history of intestinal issues.  The patient will be set up for a colonoscopy to rule out any inflammatory process as the cause of her abdominal pain with her change in bowel habits.  The patient may need to be tried on an antispasmodic if her colonoscopy is negative.  The patient has been explained the plan agrees with it.    Brooke Minium, MD. Brooke Koch    Note: This dictation was prepared with Dragon dictation along with smaller phrase technology. Any transcriptional errors that result from this process are unintentional.

## 2020-10-17 NOTE — Telephone Encounter (Signed)
Please see another phone encounter and mychart message encounters.

## 2020-10-24 ENCOUNTER — Encounter: Payer: Self-pay | Admitting: Gastroenterology

## 2020-10-24 ENCOUNTER — Other Ambulatory Visit: Payer: Self-pay

## 2020-10-25 ENCOUNTER — Other Ambulatory Visit
Admission: RE | Admit: 2020-10-25 | Discharge: 2020-10-25 | Disposition: A | Discharge status: Home / Self Care | Payer: Managed Care, Other (non HMO) | Source: Ambulatory Visit | Attending: Gastroenterology | Admitting: Gastroenterology

## 2020-10-25 DIAGNOSIS — Z20822 Contact with and (suspected) exposure to covid-19: Secondary | ICD-10-CM | POA: Insufficient documentation

## 2020-10-25 DIAGNOSIS — Z01812 Encounter for preprocedural laboratory examination: Secondary | ICD-10-CM | POA: Diagnosis not present

## 2020-10-25 LAB — SARS CORONAVIRUS 2 (TAT 6-24 HRS): SARS Coronavirus 2: NEGATIVE

## 2020-10-25 NOTE — Discharge Instructions (Signed)
General Anesthesia, Adult, Care After This sheet gives you information about how to care for yourself after your procedure. Your health care provider may also give you more specific instructions. If you have problems or questions, contact your health care provider. What can I expect after the procedure? After the procedure, the following side effects are common:  Pain or discomfort at the IV site.  Nausea.  Vomiting.  Sore throat.  Trouble concentrating.  Feeling cold or chills.  Weak or tired.  Sleepiness and fatigue.  Soreness and body aches. These side effects can affect parts of the body that were not involved in surgery. Follow these instructions at home:  For at least 24 hours after the procedure:  Have a responsible adult stay with you. It is important to have someone help care for you until you are awake and alert.  Rest as needed.  Do not: ? Participate in activities in which you could fall or become injured. ? Drive. ? Use heavy machinery. ? Drink alcohol. ? Take sleeping pills or medicines that cause drowsiness. ? Make important decisions or sign legal documents. ? Take care of children on your own. Eating and drinking  Follow any instructions from your health care provider about eating or drinking restrictions.  When you feel hungry, start by eating small amounts of foods that are soft and easy to digest (bland), such as toast. Gradually return to your regular diet.  Drink enough fluid to keep your urine pale yellow.  If you vomit, rehydrate by drinking water, juice, or clear broth. General instructions  If you have sleep apnea, surgery and certain medicines can increase your risk for breathing problems. Follow instructions from your health care provider about wearing your sleep device: ? Anytime you are sleeping, including during daytime naps. ? While taking prescription pain medicines, sleeping medicines, or medicines that make you drowsy.  Return to  your normal activities as told by your health care provider. Ask your health care provider what activities are safe for you.  Take over-the-counter and prescription medicines only as told by your health care provider.  If you smoke, do not smoke without supervision.  Keep all follow-up visits as told by your health care provider. This is important. Contact a health care provider if:  You have nausea or vomiting that does not get better with medicine.  You cannot eat or drink without vomiting.  You have pain that does not get better with medicine.  You are unable to pass urine.  You develop a skin rash.  You have a fever.  You have redness around your IV site that gets worse. Get help right away if:  You have difficulty breathing.  You have chest pain.  You have blood in your urine or stool, or you vomit blood. Summary  After the procedure, it is common to have a sore throat or nausea. It is also common to feel tired.  Have a responsible adult stay with you for the first 24 hours after general anesthesia. It is important to have someone help care for you until you are awake and alert.  When you feel hungry, start by eating small amounts of foods that are soft and easy to digest (bland), such as toast. Gradually return to your regular diet.  Drink enough fluid to keep your urine pale yellow.  Return to your normal activities as told by your health care provider. Ask your health care provider what activities are safe for you. This information is not   intended to replace advice given to you by your health care provider. Make sure you discuss any questions you have with your health care provider. Document Revised: 12/11/2017 Document Reviewed: 07/24/2017 Elsevier Patient Education  2020 [employer]vier Inc[employer]  

## 2020-10-29 ENCOUNTER — Encounter
Admission: RE | Disposition: A | Discharge status: Home / Self Care | Payer: Self-pay | Source: Home / Self Care | Attending: Gastroenterology

## 2020-10-29 ENCOUNTER — Ambulatory Visit: Payer: Managed Care, Other (non HMO) | Admitting: Anesthesiology

## 2020-10-29 ENCOUNTER — Ambulatory Visit
Admission: RE | Admit: 2020-10-29 | Discharge: 2020-10-29 | Disposition: A | Discharge status: Home / Self Care | Payer: Managed Care, Other (non HMO) | Attending: Gastroenterology | Admitting: Gastroenterology

## 2020-10-29 ENCOUNTER — Encounter: Payer: Self-pay | Admitting: Gastroenterology

## 2020-10-29 ENCOUNTER — Other Ambulatory Visit: Payer: Self-pay

## 2020-10-29 DIAGNOSIS — Z8042 Family history of malignant neoplasm of prostate: Secondary | ICD-10-CM | POA: Diagnosis not present

## 2020-10-29 DIAGNOSIS — Z882 Allergy status to sulfonamides status: Secondary | ICD-10-CM | POA: Diagnosis not present

## 2020-10-29 DIAGNOSIS — R194 Change in bowel habit: Secondary | ICD-10-CM

## 2020-10-29 DIAGNOSIS — Z8719 Personal history of other diseases of the digestive system: Secondary | ICD-10-CM | POA: Diagnosis not present

## 2020-10-29 DIAGNOSIS — K529 Noninfective gastroenteritis and colitis, unspecified: Secondary | ICD-10-CM | POA: Diagnosis not present

## 2020-10-29 DIAGNOSIS — Z8249 Family history of ischemic heart disease and other diseases of the circulatory system: Secondary | ICD-10-CM | POA: Insufficient documentation

## 2020-10-29 DIAGNOSIS — Z79899 Other long term (current) drug therapy: Secondary | ICD-10-CM | POA: Diagnosis not present

## 2020-10-29 DIAGNOSIS — Z833 Family history of diabetes mellitus: Secondary | ICD-10-CM | POA: Insufficient documentation

## 2020-10-29 DIAGNOSIS — Z83511 Family history of glaucoma: Secondary | ICD-10-CM | POA: Diagnosis not present

## 2020-10-29 DIAGNOSIS — Z803 Family history of malignant neoplasm of breast: Secondary | ICD-10-CM | POA: Insufficient documentation

## 2020-10-29 DIAGNOSIS — R1013 Epigastric pain: Secondary | ICD-10-CM

## 2020-10-29 DIAGNOSIS — G8929 Other chronic pain: Secondary | ICD-10-CM

## 2020-10-29 HISTORY — DX: Presence of spectacles and contact lenses: Z97.3

## 2020-10-29 HISTORY — PX: COLONOSCOPY WITH PROPOFOL: SHX5780

## 2020-10-29 LAB — POCT PREGNANCY, URINE: Preg Test, Ur: NEGATIVE

## 2020-10-29 SURGERY — COLONOSCOPY WITH PROPOFOL
Anesthesia: General | Site: Rectum

## 2020-10-29 MED ORDER — LIDOCAINE HCL (CARDIAC) PF 100 MG/5ML IV SOSY
PREFILLED_SYRINGE | INTRAVENOUS | Status: DC | PRN
  Administered 2020-10-29: 50 mg via INTRAVENOUS

## 2020-10-29 MED ORDER — STERILE WATER FOR IRRIGATION IR SOLN: Status: DC | PRN

## 2020-10-29 MED ORDER — SODIUM CHLORIDE 0.9 % IV SOLN: INTRAVENOUS | Status: DC

## 2020-10-29 MED ORDER — LACTATED RINGERS IV SOLN: INTRAVENOUS | Status: DC

## 2020-10-29 MED ORDER — ACETAMINOPHEN 160 MG/5ML PO SOLN: 325.0000 mg | Freq: Once | ORAL | Status: DC

## 2020-10-29 MED ORDER — ACETAMINOPHEN 325 MG PO TABS: 325.0000 mg | ORAL_TABLET | Freq: Once | ORAL | Status: DC

## 2020-10-29 MED ORDER — PROPOFOL 10 MG/ML IV BOLUS
INTRAVENOUS | Status: DC | PRN
  Administered 2020-10-29: 150 mg via INTRAVENOUS
  Administered 2020-10-29: 50 mg via INTRAVENOUS
  Administered 2020-10-29 (×5): 40 mg via INTRAVENOUS

## 2020-10-29 SURGICAL SUPPLY — 7 items
FORCEPS BIOP RAD 4 LRG CAP 4 (CUTTING FORCEPS) ×1 IMPLANT
GOWN CVR UNV OPN BCK APRN NK (MISCELLANEOUS) ×2 IMPLANT
GOWN ISOL THUMB LOOP REG UNIV (MISCELLANEOUS) ×4
KIT PRC NS LF DISP ENDO (KITS) ×1 IMPLANT
KIT PROCEDURE OLYMPUS (KITS) ×2
MANIFOLD NEPTUNE II (INSTRUMENTS) ×2 IMPLANT
WATER STERILE IRR 250ML POUR (IV SOLUTION) ×2 IMPLANT

## 2020-10-29 NOTE — Anesthesia Preprocedure Evaluation (Signed)
Anesthesia Evaluation  Patient identified by MRN, date of birth, ID band Patient awake    Reviewed: Allergy & Precautions, H&P , NPO status , Patient's Chart, lab work & pertinent test results  Airway Mallampati: II  TM Distance: >3 FB Neck ROM: full    Dental no notable dental hx.    Pulmonary    Pulmonary exam normal breath sounds clear to auscultation       Cardiovascular Normal cardiovascular exam Rhythm:regular Rate:Normal     Neuro/Psych    GI/Hepatic   Endo/Other    Renal/GU      Musculoskeletal   Abdominal   Peds  Hematology   Anesthesia Other Findings   Reproductive/Obstetrics                             Anesthesia Physical Anesthesia Plan  ASA: II  Anesthesia Plan: General   Post-op Pain Management:    Induction: Intravenous  PONV Risk Score and Plan: 3 and Treatment may vary due to age or medical condition, Propofol infusion and TIVA  Airway Management Planned: Natural Airway  Additional Equipment:   Intra-op Plan:   Post-operative Plan:   Informed Consent: I have reviewed the patients History and Physical, chart, labs and discussed the procedure including the risks, benefits and alternatives for the proposed anesthesia with the patient or authorized representative who has indicated his/her understanding and acceptance.     Dental Advisory Given  Plan Discussed with: CRNA  Anesthesia Plan Comments:         Anesthesia Quick Evaluation  

## 2020-10-29 NOTE — Transfer of Care (Signed)
Immediate Anesthesia Transfer of Care Note  Patient: Brooke Koch  Procedure(s) Performed: COLONOSCOPY WITH BIOPSY (N/A Rectum)  Patient Location: PACU  Anesthesia Type: General  Level of Consciousness: awake, alert  and patient cooperative  Airway and Oxygen Therapy: Patient Spontanous Breathing and Patient connected to supplemental oxygen  Post-op Assessment: Post-op Vital signs reviewed, Patient's Cardiovascular Status Stable, Respiratory Function Stable, Patent Airway and No signs of Nausea or vomiting  Post-op Vital Signs: Reviewed and stable  Complications: No complications documented.

## 2020-10-29 NOTE — Anesthesia Postprocedure Evaluation (Signed)
Anesthesia Post Note  Patient: Brooke Koch  Procedure(s) Performed: COLONOSCOPY WITH BIOPSY (N/A Rectum)     Patient location during evaluation: PACU Anesthesia Type: General Level of consciousness: awake and alert and oriented Pain management: satisfactory to patient Vital Signs Assessment: post-procedure vital signs reviewed and stable Respiratory status: spontaneous breathing, nonlabored ventilation and respiratory function stable Cardiovascular status: blood pressure returned to baseline and stable Postop Assessment: Adequate PO intake and No signs of nausea or vomiting Anesthetic complications: no   No complications documented.  Cherly Beach

## 2020-10-29 NOTE — Interval H&P Note (Signed)
Brooke Lame, MD [address] [place], [place] [address] [phone] Fax : [phone]  Primary Care Physician:  Lucianne Lei, MD Primary Gastroenterologist:  Dr. Allen Norris  Pre-Procedure History & Physical: HPI:  Brooke Koch is a 34 y.o. female is here for an colonoscopy.   Past Medical History:  Diagnosis Date   Gastritis    History of Helicobacter pylori infection    Wears contact lenses     Past Surgical History:  Procedure Laterality Date   ESOPHAGOGASTRODUODENOSCOPY  12/2009    Prior to Admission medications   Medication Sig Start Date End Date Taking? Authorizing Provider  ACZONE 7.5 % GEL Apply 1 application topically daily. To face 08/20/18  Yes [provider]  Multiple Vitamin (MULTIVITAMIN) tablet Take 1 tablet by mouth daily.   Yes [provider]  norethindrone-ethinyl estradiol-iron (JUNEL FE 1.5/30) 1.5-30 MG-MCG tablet Take 1 tablet by mouth daily. 08/03/20  Yes Rubie Maid, MD  Omega-3 Fatty Acids (FISH OIL PO) Take by mouth daily.   Yes [provider]  Na Sulfate-K Sulfate-Mg Sulf (SUPREP BOWEL PREP KIT) 17.5-3.13-1.6 GM/177ML SOLN Take 1 kit by mouth as directed. 10/17/20   Brooke Lame, MD    Allergies as of 10/17/2020 - Review Complete 10/17/2020  Allergen Reaction Noted   Septra [sulfamethoxazole-trimethoprim] Hives 01/03/2016    Family History  Problem Relation Age of Onset   Hypertension Mother    Glaucoma Mother    Hyperlipidemia Mother    Hypertension Father    Diabetes Father    Prostate cancer Father        cancer free x 1 year   Breast cancer Cousin    Breast cancer Cousin     Social History   Socioeconomic History   Marital status: Single    Spouse name: Not on file   Number of children: Not on file   Years of education: Not on file   Highest education level: Not on file  Occupational History   Not on file  Tobacco Use   Smoking status: Never Smoker   Smokeless tobacco:  Never Used  Vaping Use   Vaping Use: Never used  Substance and Sexual Activity   Alcohol use: Yes    Alcohol/week: 0.0 standard drinks    Comment: social drinker   Drug use: No   Sexual activity: Yes    Birth control/protection: Pill  Other Topics Concern   Not on file  Social History Narrative   Not on file   Social Determinants of Health   Financial Resource Strain:    Difficulty of Paying Living Expenses: Not on file  Food Insecurity:    Worried About Charity fundraiser in the Last Year: Not on file   [employer] of Food in the Last Year: Not on file  Transportation Needs:    Lack of Transportation (Medical): Not on file   Lack of Transportation (Non-Medical): Not on file  Physical Activity:    Days of Exercise per Week: Not on file   Minutes of Exercise per Session: Not on file  Stress:    Feeling of Stress : Not on file  Social Connections:    Frequency of Communication with Friends and Family: Not on file   Frequency of Social Gatherings with Friends and Family: Not on file   Attends Religious Services: Not on file   Active Member of Clubs or Organizations: Not on file   Attends Archivist Meetings: Not on file  Marital Status: Not on file  Intimate Partner Violence:    Fear of Current or Ex-Partner: Not on file   Emotionally Abused: Not on file   Physically Abused: Not on file   Sexually Abused: Not on file    Review of Systems: See HPI, otherwise negative ROS  Physical Exam: BP (!) 150/89   Pulse 77   Temp 98.4 F (36.9 C) (Temporal)   Resp 16   Ht 5' 4" (1.626 m)   Wt 64.4 kg   SpO2 98%   BMI 24.37 kg/m  General:   Alert,  pleasant and cooperative in NAD Head:  Normocephalic and atraumatic. Neck:  Supple; no masses or thyromegaly. Lungs:  Clear throughout to auscultation.    Heart:  Regular rate and rhythm. Abdomen:  Soft, nontender and nondistended. Normal bowel sounds, without guarding, and without rebound.   Neurologic:  Alert and   oriented x4;  grossly normal neurologically.  Impression/Plan: [name] is here for an colonoscopy to be performed for diarrhea  Risks, benefits, limitations, and alternatives regarding  colonoscopy have been reviewed with the patient.  Questions have been answered.  All parties agreeable.   Brooke Lame, MD  10/29/2020, 8:58 AM

## 2020-10-29 NOTE — Op Note (Signed)
[facility] Gastroenterology Patient Name: Brooke Koch Procedure Date: 10/29/2020 8:57 AM MRN: [id number] Account #: [id number] Date of Birth: [birth year] Admit Type: Outpatient Age: 34 Room: [facility] OR ROOM 01 Gender: Female Note Status: Finalized Procedure:             Colonoscopy Indications:           Chronic diarrhea Providers:             Midge Minium MD, MD Referring MD:          Geraldo Pitter MD (Referring MD) Medicines:             Propofol per Anesthesia Complications:         No immediate complications. Procedure:             Pre-Anesthesia Assessment:                        - Prior to the procedure, a History and Physical was                         performed, and patient medications and allergies were                         reviewed. The patient's tolerance of previous                         anesthesia was also reviewed. The risks and benefits                         of the procedure and the sedation options and risks                         were discussed with the patient. All questions were                         answered, and informed consent was obtained. Prior                         Anticoagulants: The patient has taken no previous                         anticoagulant or antiplatelet agents. ASA Grade                         Assessment: II - A patient with mild systemic disease.                         After reviewing the risks and benefits, the patient                         was deemed in satisfactory condition to undergo the                         procedure.                        After obtaining informed consent, the colonoscope was  passed under direct vision. Throughout the procedure,                         the patient's blood pressure, pulse, and oxygen                         saturations were monitored continuously. The was                         introduced through the anus and advanced to the the                          terminal ileum. The colonoscopy was performed without                         difficulty. The patient tolerated the procedure well.                         The quality of the bowel preparation was excellent. Findings:      The perianal and digital rectal examinations were normal.      The terminal ileum appeared normal. Biopsies were taken with a cold       forceps for histology.      The colon (entire examined portion) appeared normal. Biopsies were taken       with a cold forceps for histology. Impression:            - The examined portion of the ileum was normal.                         Biopsied.                        - The entire examined colon is normal. Biopsied. Recommendation:        - Discharge patient to home.                        - Resume previous diet.                        - Continue present medications.                        - Await pathology results. Procedure Code(s):     --- Professional ---                        [phone], Colonoscopy, flexible; with biopsy, single or                         multiple Diagnosis Code(s):     --- Professional ---                        K52.9, Noninfective gastroenteritis and colitis,                         unspecified CPT copyright 2019 American Medical Association. All rights reserved. The codes documented in this report are preliminary and upon coder review may  be revised to meet current compliance requirements. Midge Minium MD, MD 10/29/2020 9:22:41 AM This report has been  signed electronically. Number of Addenda: 0 Note Initiated On: 10/29/2020 8:57 AM Scope Withdrawal Time: 0 hours 7 minutes 37 seconds  Total Procedure Duration: 0 hours 10 minutes 41 seconds  Estimated Blood Loss:  Estimated blood loss: none.      [facility]

## 2020-10-29 NOTE — Anesthesia Procedure Notes (Signed)
Date/Time: 10/29/2020 9:05 AM Performed by: Maree Krabbe, CRNA Pre-anesthesia Checklist: Patient identified, Emergency Drugs available, Suction available, Timeout performed and Patient being monitored Patient Re-evaluated:Patient Re-evaluated prior to induction Oxygen Delivery Method: Nasal cannula Placement Confirmation: positive ETCO2

## 2020-10-30 ENCOUNTER — Encounter: Payer: Self-pay | Admitting: Gastroenterology

## 2020-10-31 LAB — SURGICAL PATHOLOGY

## 2020-11-06 ENCOUNTER — Telehealth: Payer: Self-pay

## 2020-11-06 NOTE — Telephone Encounter (Signed)
Patient verbalized understanding of results. She states the abdominal pain and diarrhea has improved since procedure. She will call us if it changes or gets worse to make appointment

## 2020-11-06 NOTE — Telephone Encounter (Signed)
-----   Message from Midge Minium, MD sent at 11/06/2020 11:43 AM EST ----- Let the patient know that the biopsies were all normal of the terminal ileum and colon were any inflammation or any cause for the diarrhea.  The patient is still having change in bowel habits and abdominal pain please have her come in and see me in the office.

## 2020-11-06 NOTE — Telephone Encounter (Signed)
Called and left a message for call back sent patient a mychart message.

## 2020-12-04 ENCOUNTER — Encounter: Payer: Managed Care, Other (non HMO) | Admitting: Obstetrics and Gynecology

## 2020-12-07 ENCOUNTER — Encounter: Payer: Self-pay | Admitting: Obstetrics and Gynecology

## 2020-12-07 ENCOUNTER — Ambulatory Visit (INDEPENDENT_AMBULATORY_CARE_PROVIDER_SITE_OTHER): Payer: BC Managed Care – PPO | Admitting: Obstetrics and Gynecology

## 2020-12-07 ENCOUNTER — Other Ambulatory Visit: Payer: Self-pay

## 2020-12-07 VITALS — BP 151/89 | HR 69 | Ht 64.0 in | Wt 145.1 lb

## 2020-12-07 DIAGNOSIS — N921 Excessive and frequent menstruation with irregular cycle: Secondary | ICD-10-CM

## 2020-12-07 DIAGNOSIS — N898 Other specified noninflammatory disorders of vagina: Secondary | ICD-10-CM | POA: Diagnosis not present

## 2020-12-07 NOTE — Patient Instructions (Signed)
Dysfunctional Uterine Bleeding Dysfunctional uterine bleeding is abnormal bleeding from the uterus. Dysfunctional uterine bleeding includes:  A menstrual period that comes earlier or later than usual.  A menstrual period that is lighter or heavier than usual, or has large blood clots.  Vaginal bleeding between menstrual periods.  Skipping one or more menstrual periods.  Vaginal bleeding after sex.  Vaginal bleeding after menopause. Follow these instructions at home: Eating and drinking   Eat well-balanced meals. Include foods that are high in iron, such as liver, meat, shellfish, green leafy vegetables, and eggs.  To prevent or treat constipation, your health care provider may recommend that you: ? Drink enough fluid to keep your urine pale yellow. ? Take over-the-counter or prescription medicines. ? Eat foods that are high in fiber, such as beans, whole grains, and fresh fruits and vegetables. ? Limit foods that are high in fat and processed sugars, such as fried or sweet foods. Medicines  Take over-the-counter and prescription medicines only as told by your health care provider.  Do not change medicines without talking with your health care provider.  Aspirin or medicines that contain aspirin may make the bleeding worse. Do not take those medicines: ? During the week before your menstrual period. ? During your menstrual period.  If you were prescribed iron pills, take them as told by your health care provider. Iron pills help to replace iron that your body loses because of this condition. Activity  If you need to change your sanitary pad or tampon more than one time every 2 hours: ? Lie in bed with your feet raised (elevated). ? Place a cold pack on your lower abdomen. ? Rest as much as possible until the bleeding stops or slows down.  Do not try to lose weight until the bleeding has stopped and your blood iron level is back to normal. General instructions   For two  months, write down: ? When your menstrual period starts. ? When your menstrual period ends. ? When any abnormal vaginal bleeding occurs. ? What problems you notice.  Keep all follow up visits as told by your health care provider. This is important. Contact a health care provider if you:  Feel light-headed or weak.  Have nausea and vomiting.  Cannot eat or drink without vomiting.  Feel dizzy or have diarrhea while you are taking medicines.  Are taking birth control pills or hormones, and you want to change them or stop taking them. Get help right away if:  You develop a fever or chills.  You need to change your sanitary pad or tampon more than one time per hour.  Your vaginal bleeding becomes heavier, or your flow contains clots more often.  You develop pain in your abdomen.  You lose consciousness.  You develop a rash. Summary  Dysfunctional uterine bleeding is abnormal bleeding from the uterus.  It includes menstrual bleeding of abnormal duration, volume, or regularity.  Bleeding after sex and after menopause are also considered dysfunctional uterine bleeding. This information is not intended to replace advice given to you by your health care provider. Make sure you discuss any questions you have with your health care provider. Document Revised: 05/19/2018 Document Reviewed: 05/19/2018 Elsevier Patient Education  2020 Elsevier Inc.  

## 2020-12-07 NOTE — Progress Notes (Signed)
Pt present for spotting between cycles. Pt c/o 2 cycles in one month both cycles were light.

## 2020-12-07 NOTE — Progress Notes (Signed)
    GYNECOLOGY PROGRESS NOTE  Subjective:   Patient ID: Brooke Koch, female    DOB: May 14, 1986, 35 y.o.   MRN: [id number]  HPI  Patient is a 34 y.o. G26P1001 female who presents for complaints of intermenstrual bleeding on combined OCPs.  Patient has been on OCPs for several years, had pills changed within the past year due to issues with intermenstrual spotting. She is still complaining of these symptoms.  Symptoms have been ongoing for ~ 1 year.  Notes that she will have a cycle, sometimes lasting 7 days, followed by 3 days off, then 3 days of returned bleeding, then another 5 days off, followed by another 2 days of bleeding. Bleeding is usually light, but still bothersome. Denies passage of clots or pelvic pain/cramping with the episodes.    The following portions of the patient's history were reviewed and updated as appropriate: allergies, current medications, past family history, past medical history, past social history, past surgical history and problem list.  Review of Systems Pertinent items noted in HPI and remainder of comprehensive ROS otherwise negative.   Objective:   Blood pressure (!) 151/89, pulse 69, height 5\' 4"  (1.626 m), weight 145 lb 1.6 oz (65.8 kg), last menstrual period 12/01/2020. General appearance: alert and no distress Abdomen: soft, non-tender; bowel sounds normal; no masses,  no organomegaly Pelvic: external genitalia normal, rectovaginal septum normal.  Vagina without discharge, scant light brown blood in vault.  Cervix normal appearing, no lesions and no motion tenderness.  Uterus mobile, nontender, normal shape and size.  Adnexae non-palpable, nontender bilaterally.  Extremities: extremities normal, atraumatic, no cyanosis or edema Neurologic: Grossly normal   Assessment:   Breakthrough bleeding on OCPs   Plan:   Patient has abnormal uterine bleeding (breakthrough bleeding on OCPs). She has a normal exam, no evidence of lesions.  Will order Nuswab  and pelvic ultrasound to evaluate for any structural gynecologic abnormalities.  Patient will f/u in 1 week to discuss these results and plans for further evaluation/management. Reiterated the possibility that she may need to consider contraceptive options other than OCPs to better control her bleeding.    14/10/2020, MD [facility]

## 2020-12-11 ENCOUNTER — Telehealth: Payer: Self-pay

## 2020-12-11 LAB — NUSWAB VAGINITIS PLUS (VG+)
Atopobium vaginae: HIGH Score — AB
BVAB 2: HIGH Score — AB
Candida albicans, NAA: NEGATIVE
Candida glabrata, NAA: NEGATIVE
Chlamydia trachomatis, NAA: NEGATIVE
Megasphaera 1: HIGH Score — AB
Neisseria gonorrhoeae, NAA: NEGATIVE
Trich vag by NAA: NEGATIVE

## 2020-12-11 MED ORDER — METRONIDAZOLE 500 MG PO TABS: 500.0000 mg | ORAL_TABLET | Freq: Two times a day (BID) | ORAL | 0 refills | Status: DC

## 2020-12-11 MED ORDER — METRONIDAZOLE 500 MG PO TABS
500.0000 mg | ORAL_TABLET | Freq: Two times a day (BID) | ORAL | 0 refills | Status: DC
Start: 2020-12-11 — End: 2021-01-30

## 2020-12-11 NOTE — Addendum Note (Signed)
Addended by: Fabian November on: 12/11/2020 08:24 AM   Modules accepted: Orders

## 2020-12-11 NOTE — Telephone Encounter (Signed)
Pt called in and stated that her pharmacy is closed the pt is requesting the the metroNIDAZOLE (FLAGYL) be sent to [facility] in [place] on [address]. Please advise

## 2020-12-11 NOTE — Telephone Encounter (Signed)
Pt is aware that her medication has been sent to [facility] in [place].

## 2020-12-13 ENCOUNTER — Other Ambulatory Visit: Payer: BC Managed Care – PPO

## 2020-12-13 ENCOUNTER — Encounter: Payer: BC Managed Care – PPO | Admitting: Obstetrics and Gynecology

## 2020-12-26 ENCOUNTER — Other Ambulatory Visit: Payer: Medicaid Other

## 2020-12-27 ENCOUNTER — Other Ambulatory Visit: Payer: BC Managed Care – PPO

## 2020-12-27 ENCOUNTER — Encounter: Payer: BC Managed Care – PPO | Admitting: Obstetrics and Gynecology

## 2021-01-01 ENCOUNTER — Encounter: Payer: BC Managed Care – PPO | Admitting: Obstetrics and Gynecology

## 2021-01-01 ENCOUNTER — Other Ambulatory Visit: Payer: BC Managed Care – PPO

## 2021-01-08 ENCOUNTER — Other Ambulatory Visit: Payer: BC Managed Care – PPO

## 2021-01-08 ENCOUNTER — Encounter: Payer: BC Managed Care – PPO | Admitting: Obstetrics and Gynecology

## 2021-01-28 ENCOUNTER — Other Ambulatory Visit: Payer: Managed Care, Other (non HMO)

## 2021-01-30 ENCOUNTER — Ambulatory Visit (INDEPENDENT_AMBULATORY_CARE_PROVIDER_SITE_OTHER): Payer: BC Managed Care – PPO

## 2021-01-30 ENCOUNTER — Other Ambulatory Visit: Payer: BC Managed Care – PPO

## 2021-01-30 ENCOUNTER — Ambulatory Visit (INDEPENDENT_AMBULATORY_CARE_PROVIDER_SITE_OTHER): Payer: BC Managed Care – PPO | Admitting: Obstetrics and Gynecology

## 2021-01-30 ENCOUNTER — Encounter: Payer: Self-pay | Admitting: Obstetrics and Gynecology

## 2021-01-30 ENCOUNTER — Other Ambulatory Visit: Payer: Self-pay

## 2021-01-30 VITALS — BP 149/82 | HR 65 | Ht 64.0 in | Wt 144.0 lb

## 2021-01-30 DIAGNOSIS — N921 Excessive and frequent menstruation with irregular cycle: Secondary | ICD-10-CM

## 2021-01-30 DIAGNOSIS — E785 Hyperlipidemia, unspecified: Secondary | ICD-10-CM

## 2021-01-30 DIAGNOSIS — R030 Elevated blood-pressure reading, without diagnosis of hypertension: Secondary | ICD-10-CM | POA: Diagnosis not present

## 2021-01-30 NOTE — Progress Notes (Signed)
Pt present for follow up after ultrasound results. Pt stated that she would like to discuss other forms of birth control.

## 2021-01-30 NOTE — Progress Notes (Signed)
    GYNECOLOGY PROGRESS NOTE  Subjective:    Patient ID: Brooke Koch, female    DOB: 1986/02/10, 35 y.o.   MRN: [id number]  HPI  Patient is a 35 y.o. G48P1001 female who presents for follow up of ultrasound results of persistent breakthrough bleeding on OCPs.  Notes that since her last visit, her bleeding has mostly resolved.    She also presents for repeat of lipid panel. Had elevated levels ~ 6 months ago.   The following portions of the patient's history were reviewed and updated as appropriate: allergies, current medications, past family history, past medical history, past social history, past surgical history and problem list.  Review of Systems Pertinent items noted in HPI and remainder of comprehensive ROS otherwise negative.  Objective:   Blood pressure (!) 149/82, pulse 65, height 5\' 4"  (1.626 m), weight 144 lb (65.3 kg), last menstrual period 12/25/2020. General appearance: alert and no distress  Remainder of exam deferred.    Imaging:  Patient Name: Brooke Koch DOB: 09-26-1986 MRN: 1986 ULTRASOUND REPORT  Location: [facility]  Date of Service: 01/30/2021     Indications:AUB Findings:  The uterus is anteverted and measures 8.9 x 3.3 x 3.7 cm.. Echo texture is homogenous without evidence of focal masses.  The Endometrium measures 6 mm.  Right Ovary measures 2.1 x 3.2 x 1.6 cm. It is normal in appearance. Left Ovary measures 3.1 x 1.4 x 2.4 cm. It is normal in appearance. Survey of the adnexa demonstrates no adnexal masses. There is trace amount of free fluid in the cul de sac.  Impression: 1. Trace amount of free fluid with-in the cul-de-sac.  Recommendations: 1.Clinical correlation with the patient's History and Physical Exam.   Jenine M. 03/30/2021    RDMS   Assessment:   1. Breakthrough bleeding on OCPs   2. Dyslipidemia      Plan:   - Reviewed ultrasound results with patient. No obvious structural cause for breakthrough  bleeding.  Bleeding currently has stopped, but can consider break from OCPs for 1-2 months as she has been on them for many years. Encouraged to use a back up method if sexually active. Can also consider an alternative method of contraception.  - Elevated BPs over past several visits.  Advised again on option to take a break from OCPs to see if BPs normalize. Has appt with new PCP next month. If BPs are better, may likely need to consider progesterone only method or non-hormonal method - Lipids can also be influencd by hormonal contraceptives. Repeated levels today. Patient has been trying to maintain a healthy diet and exercising. Will notify of results by Mychart.    Return to clinic for any scheduled appointments or for any gynecologic concerns as needed.    A total of 20 minutes were spent face-to-face with the patient during this encounter and over half of that time dealt with counseling and coordination of care.   Marciano Sequin, MD [facility]

## 2021-01-31 LAB — LIPID PANEL
Chol/HDL Ratio: 4.8 ratio — ABNORMAL HIGH (ref 0.0–4.4)
Cholesterol, Total: 248 mg/dL — ABNORMAL HIGH (ref 100–199)
HDL: 52 mg/dL (ref >39)
LDL Chol Calc (NIH): 166 mg/dL — ABNORMAL HIGH (ref 0–99)
Triglycerides: 167 mg/dL — ABNORMAL HIGH (ref 0–149)
VLDL Cholesterol Cal: 30 mg/dL (ref 5–40)

## 2021-01-31 NOTE — Patient Instructions (Signed)
Lipid Profile Test Why am I having this test? The lipid profile test can be used to help evaluate your risk for developing heart disease. The test is also used to monitor your levels during treatment for high cholesterol to see if you are reaching your goals. What is being tested? A lipid profile measures the following:  Total cholesterol. Cholesterol is a waxy, fat-like substance in your blood. If your total cholesterol level is high, this can increase your risk for heart disease.  High-density lipoprotein (HDL). This is known as the good cholesterol. Having high levels of HDL decreases your risk for heart disease. Your HDL level may be low if you smoke or do not get enough exercise.  Low-density lipoprotein (LDL). This is known as the bad cholesterol. This type causes plaque to build up in your arteries. Having a low level of LDL is best. Having high levels of LDL increases your risk for heart disease.  Cholesterol to HDL ratio. This is calculated by dividing your total cholesterol by your HDL cholesterol. The ratio is used by health care providers to determine your risk for heart disease. A low ratio is best.  Triglycerides. These are fats that your body can store or burn for energy. Low levels are best. Having high levels of triglycerides increases your risk for heart disease. What kind of sample is taken? A blood sample is required for this test. It is usually collected by inserting a needle into a blood vessel.   How do I prepare for this test? Do not drink alcohol starting at least 24 hours before your test. Follow any instructions from your health care provider about dietary restrictions before your test. Do not eat or drink anything other than water after midnight on the night before the test, or as told by your health care provider. Tell a health care provider about:  All medicines you are taking, including vitamins, herbs, eye drops, creams, and over-the-counter medicines.  Any  medical conditions you have.  Whether you are pregnant or may be pregnant. How are the results reported? Your test results will be reported as values that indicate your cholesterol and triglyceride levels. Your health care provider will compare your results to normal ranges that were established after testing a large group of people (reference ranges). Reference ranges may vary among labs and hospitals. For this test, common reference ranges are: Total cholesterol  Adult or elderly: less than 200 mg/dL.  Child: 120-200 mg/dL.  Infant: 70-175 mg/dL.  Newborn: 53-135 mg/dL. HDL  Female: greater than 45 mg/dL.  Female: greater than 55 mg/dL. HDL reference values based on your risk for heart disease:  Low risk for heart disease: ? Female: 60 mg/dL. ? Female: 70 mg/dL.  Moderate risk for heart disease: ? Female: 45 mg/dL. ? Female: 55 mg/dL.  High risk for heart disease: ? Female: 25 mg/dL. ? Female: 35 mg/dL. LDL  Adults: Your health care provider will determine a target level for LDL based on your risk for heart disease. ? If you are at low risk, your LDL should be 130 mg/dL or less. ? If you are at moderate risk, your LDL should be 100 mg/dL or less. ? If you are at high risk, your LDL should be 70 mg/dL or less.  Children: less than 110 mg/dL. Cholesterol to HDL ratio Reference values based on your risk for heart disease:  Risk that is half the average risk: ? Female: 3.4. ? Female: 3.3.  Average risk: ? Female: 5.0. ?   Female: 4.4.  Risk that is two times average (moderate risk): ? Female: 10.0. ? Female: 7.0.  Risk that is three times average (high risk): ? Female: 24.0. ? Female: 11.0. Triglycerides  Adult or elderly: ? Female: 40-160 mg/dL. ? Female: 35-135 mg/dL.  Children 16-19 years old: ? Female: 40-163 mg/dL. ? Female: 40-128 mg/dL.  Children 12-15 years old: ? Female: 36-138 mg/dL. ? Female: 41-138 mg/dL.  Children 6-11 years old: ? Female: 31-108  mg/dL. ? Female: 35-114 mg/dL.  Children 0-5 years old: ? Female: 30-86 mg/dL. ? Female: 32-99 mg/dL. Triglycerides should be less than 400 mg/dL even when you are not fasting. What do the results mean? Results that are within the reference ranges are considered normal. Total cholesterol, LDL, and triglyceride levels that are higher than the reference ranges can mean that you have an increased risk for heart disease. An HDL level that is lower than the reference range can also indicate an increased risk. Talk with your health care provider about what your results mean. Questions to ask your health care provider Ask your health care provider, or the department that is doing the test:  When will my results be ready?  How will I get my results?  What are my treatment options?  What other tests do I need?  What are my next steps? Summary  The lipid profile test can be used to help predict the likelihood that you will develop heart disease. It can also help monitor your cholesterol levels during treatment.  A lipid profile measures your total cholesterol, high-density lipoprotein (HDL), low-density lipoprotein (LDL), cholesterol to HDL ratio, and triglycerides.  Total cholesterol, LDL, and triglyceride levels that are higher than the reference ranges can indicate an increased risk for heart disease.  An HDL level that is lower than the reference range can indicate an increased risk for heart disease.  Talk with your health care provider about what your results mean. This information is not intended to replace advice given to you by your health care provider. Make sure you discuss any questions you have with your health care provider. Document Revised: 03/28/2020 Document Reviewed: 03/28/2020 Elsevier Patient Education  2021 Elsevier Inc.  

## 2021-02-22 ENCOUNTER — Encounter: Payer: Self-pay | Admitting: Obstetrics and Gynecology

## 2021-03-06 ENCOUNTER — Encounter: Payer: Self-pay | Admitting: Physician Assistant

## 2021-03-06 ENCOUNTER — Ambulatory Visit (INDEPENDENT_AMBULATORY_CARE_PROVIDER_SITE_OTHER): Payer: BC Managed Care – PPO | Admitting: Physician Assistant

## 2021-03-06 ENCOUNTER — Other Ambulatory Visit: Payer: Self-pay

## 2021-03-06 VITALS — BP 147/96 | HR 68 | Temp 98.4°F | Ht 64.0 in | Wt 141.4 lb

## 2021-03-06 DIAGNOSIS — I10 Essential (primary) hypertension: Secondary | ICD-10-CM

## 2021-03-06 MED ORDER — AMLODIPINE BESYLATE 5 MG PO TABS: 5.0000 mg | ORAL_TABLET | Freq: Every day | ORAL | 0 refills | Status: DC

## 2021-03-06 NOTE — Progress Notes (Signed)
New patient visit   Patient: Brooke Koch   DOB: Mar 22, 1986   35 y.o. Female  MRN: [id number] Visit Date: 03/06/2021  Today's healthcare provider: Trey Sailors, PA-C   Chief Complaint  Patient presents with  . New Patient (Initial Visit)  I,Brooke Koch M Thong Feeny,acting as a scribe for Trey Sailors, PA-C.,have documented all relevant documentation on the behalf of Trey Sailors, PA-C,as directed by  Trey Sailors, PA-C while in the presence of Trey Sailors, PA-C.  Subjective    Brooke Koch is a 35 y.o. female who presents today as a new patient to establish care.  HPI   Living in the area, live with daughter aged 33. Works at Occupational hygienist for [employer].   She presents today to establish care. Previous PCP Dr. Parke Simmers in [place], [place]. She has been having more recent elevated BP. She had trial off estrogen containing OCP. She is off of birth control and still has high blood pressure. She is not using any cold or flu medications. She does use ibuprofen two times per month. Not using any nasal spray. No smoking. No drug use.   BP Readings from Last 3 Encounters:  03/06/21 (!) 147/96  01/30/21 (!) 149/82  12/07/20 (!) 151/89   Wt Readings from Last 3 Encounters:  03/06/21 141 lb 6.4 oz (64.1 kg)  01/30/21 144 lb (65.3 kg)  12/07/20 145 lb 1.6 oz (65.8 kg)     Past Medical History:  Diagnosis Date  . Gastritis   . History of Helicobacter pylori infection   . Wears contact lenses    Past Surgical History:  Procedure Laterality Date  . COLONOSCOPY WITH PROPOFOL N/A 10/29/2020   Procedure: COLONOSCOPY WITH BIOPSY;  Surgeon: Midge Minium, MD;  Location: [facility];  Service: Endoscopy;  Laterality: N/A;  . ESOPHAGOGASTRODUODENOSCOPY  12/2009   Family Status  Relation Name Status  . Mother  Alive  . Father  Alive  . Cousin  Alive  . Cousin  Alive   Family History  Problem Relation Age of Onset  . Hypertension Mother   . Glaucoma Mother   .  Hyperlipidemia Mother   . Hypertension Father   . Diabetes Father   . Prostate cancer Father        cancer free x 1 year  . Breast cancer Cousin   . Breast cancer Cousin    Social History   Socioeconomic History  . Marital status: Single    Spouse name: Not on file  . Number of children: Not on file  . Years of education: Not on file  . Highest education level: Not on file  Occupational History  . Not on file  Tobacco Use  . Smoking status: Never Smoker  . Smokeless tobacco: Never Used  Vaping Use  . Vaping Use: Never used  Substance and Sexual Activity  . Alcohol use: Yes    Alcohol/week: 0.0 standard drinks    Comment: social drinker  . Drug use: No  . Sexual activity: Yes  Other Topics Concern  . Not on file  Social History Narrative  . Not on file   Social Determinants of Health   Financial Resource Strain: Not on file  Food Insecurity: Not on file  Transportation Needs: Not on file  Physical Activity: Not on file  Stress: Not on file  Social Connections: Not on file   Outpatient Medications Prior to Visit  Medication Sig  . ACZONE 7.5 % GEL Apply 1  application topically daily. To face  . Multiple Vitamin (MULTIVITAMIN) tablet Take 1 tablet by mouth daily.  . Omega-3 Fatty Acids (FISH OIL PO) Take by mouth daily.  . [DISCONTINUED] norethindrone-ethinyl estradiol-iron (JUNEL FE 1.5/30) 1.5-30 MG-MCG tablet Take 1 tablet by mouth daily. (Patient not taking: Reported on 03/06/2021)   No facility-administered medications prior to visit.   Allergies  Allergen Reactions  . Septra [Sulfamethoxazole-Trimethoprim] Hives     There is no immunization history on file for this patient.  Health Maintenance  Topic Date Due  . Hepatitis C Screening  Never done  . COVID-19 Vaccine (1) Never done  . HIV Screening  Never done  . TETANUS/TDAP  Never done  . INFLUENZA VACCINE  04/22/2021 (Originally 07/22/2020)  . PAP SMEAR-Modifier  08/04/2023  . HPV VACCINES  Aged  Out    Patient Care Team: Renaye Rakers, MD as PCP - General (Family Medicine)  Review of Systems  Constitutional: Negative.   HENT: Negative.   Eyes: Negative.   Respiratory: Negative.   Cardiovascular: Negative.   Gastrointestinal: Negative.   Endocrine: Negative.   Genitourinary: Negative.   Musculoskeletal: Negative.   Skin: Negative.   Allergic/Immunologic: Negative.   Neurological: Negative.   Hematological: Negative.   Psychiatric/Behavioral: Negative.       Objective    BP (!) 147/96 (BP Location: Left Arm, Patient Position: Sitting, Cuff Size: Normal)   Pulse 68   Temp 98.4 F (36.9 C) (Oral)   Ht 5\' 4"  (1.626 m)   Wt 141 lb 6.4 oz (64.1 kg)   LMP 02/23/2021 (Exact Date)   SpO2 98%   BMI 24.27 kg/m  Physical Exam Constitutional:      Appearance: Normal appearance.  HENT:     Right Ear: Tympanic membrane, ear canal and external ear normal.     Left Ear: Tympanic membrane, ear canal and external ear normal.  Cardiovascular:     Rate and Rhythm: Normal rate and regular rhythm.     Pulses: Normal pulses.     Heart sounds: Normal heart sounds.  Pulmonary:     Effort: Pulmonary effort is normal.     Breath sounds: Normal breath sounds.  Abdominal:     General: Abdomen is flat. Bowel sounds are normal.     Palpations: Abdomen is soft.  Skin:    General: Skin is warm and dry.  Neurological:     General: No focal deficit present.     Mental Status: She is alert and oriented to person, place, and time.  Psychiatric:        Mood and Affect: Mood normal.        Behavior: Behavior normal.      Depression Screen PHQ 2/9 Scores 03/06/2021  PHQ - 2 Score 0  PHQ- 9 Score 0   No results found for any visits on 03/06/21.  Assessment & Plan     1. Primary hypertension  Start amlodipine as below.   - amLODipine (NORVASC) 5 MG tablet; Take 1 tablet (5 mg total) by mouth daily.  Dispense: 90 tablet; Refill: 0   Return in about 6 weeks (around 04/17/2021).      I4/29/2022, PA-C, have reviewed all documentation for this visit. The documentation on 03/07/21 for the exam, diagnosis, procedures, and orders are all accurate and complete.  The entirety of the information documented in the History of Present Illness, Review of Systems and Physical Exam were personally obtained by me. Portions of this information were  initially documented by [facility] and reviewed by me for thoroughness and accuracy.     Paulene Floor   [facility] [phone] (phone) [phone] (fax)  [place]

## 2021-03-06 NOTE — Patient Instructions (Signed)

## 2021-03-12 MED ORDER — SLYND 4 MG PO TABS: 1.0000 | ORAL_TABLET | Freq: Every day | ORAL | 3 refills | Status: DC

## 2021-03-13 ENCOUNTER — Other Ambulatory Visit: Payer: Self-pay

## 2021-03-14 ENCOUNTER — Other Ambulatory Visit: Payer: Self-pay

## 2021-03-14 MED ORDER — SLYND 4 MG PO TABS: 1.0000 | ORAL_TABLET | Freq: Every day | ORAL | 3 refills | Status: DC

## 2021-04-09 ENCOUNTER — Ambulatory Visit: Payer: Self-pay | Admitting: Family Medicine

## 2021-05-03 ENCOUNTER — Encounter: Payer: Self-pay | Admitting: Obstetrics and Gynecology

## 2021-05-23 ENCOUNTER — Telehealth: Payer: Self-pay

## 2021-05-23 DIAGNOSIS — I10 Essential (primary) hypertension: Secondary | ICD-10-CM

## 2021-05-23 MED ORDER — AMLODIPINE BESYLATE 5 MG PO TABS: 5.0000 mg | ORAL_TABLET | Freq: Every day | ORAL | 0 refills | Status: DC

## 2021-05-23 NOTE — Telephone Encounter (Signed)
FYI, please review and advise if okay to have appointment pushed out this far?KW

## 2021-05-23 NOTE — Telephone Encounter (Signed)
[facility] faxed refill request for the following medications:  amLODipine (NORVASC) 5 MG tablet  Please advise. Thanks TNP

## 2021-05-23 NOTE — Telephone Encounter (Signed)
Called and spoke with patient and went over message below she states that she did not ask the question about appointment or any scheduling question. She states that she let phone team member know that she put in a refill request but wasn't sure if it would be filled because appt had not been made at the time. Patient was advised of provider message below. KW

## 2021-05-23 NOTE — Telephone Encounter (Signed)
Copied from CRM [id number]. Topic: Appointment Scheduling - Scheduling Inquiry for Clinic >> May 23, 2021  8:23 AM Elliot Gault wrote: Patient called back to [facility] her 04/09/2021 BP appointment with Dr. Beryle Flock, patient scheduled for 07/23/2021 and unsure if PCP would like to see her sooner. Please update patient PCP she states she has not seen Dr. Parke Simmers in years

## 2021-05-23 NOTE — Telephone Encounter (Signed)
Saw Adriana in 3/22 and advised to f/u in 6 wks. Needs to see someone in the office earlier than my first available in 8/22. She can see anyone.

## 2021-05-23 NOTE — Addendum Note (Signed)
Addended by: Kavin Leech E on: 05/23/2021 03:18 PM   Modules accepted: Orders

## 2021-06-25 ENCOUNTER — Other Ambulatory Visit: Payer: Self-pay | Admitting: Family Medicine

## 2021-06-25 DIAGNOSIS — I10 Essential (primary) hypertension: Secondary | ICD-10-CM

## 2021-07-23 ENCOUNTER — Ambulatory Visit (INDEPENDENT_AMBULATORY_CARE_PROVIDER_SITE_OTHER): Payer: BC Managed Care – PPO | Admitting: Family Medicine

## 2021-07-23 ENCOUNTER — Other Ambulatory Visit: Payer: Self-pay

## 2021-07-23 ENCOUNTER — Encounter: Payer: Self-pay | Admitting: Family Medicine

## 2021-07-23 VITALS — BP 127/93 | HR 69 | Temp 98.6°F | Resp 16 | Ht 64.0 in | Wt 141.0 lb

## 2021-07-23 DIAGNOSIS — I10 Essential (primary) hypertension: Secondary | ICD-10-CM | POA: Diagnosis not present

## 2021-07-23 MED ORDER — AMLODIPINE BESYLATE 5 MG PO TABS: ORAL_TABLET | ORAL | 1 refills | Status: DC

## 2021-07-23 NOTE — Progress Notes (Signed)
Established patient visit   Patient: Brooke Koch   DOB: 12-Aug-1986   35 y.o. Female  MRN: [id number] Visit Date: 07/23/2021  Today's healthcare provider: Shirlee Latch, MD   Chief Complaint  Patient presents with   Hypertension   Subjective    HPI  Hypertension, follow-up  BP Readings from Last 3 Encounters:  07/23/21 (!) 127/93  03/06/21 (!) 147/96  01/30/21 (!) 149/82   Wt Readings from Last 3 Encounters:  07/23/21 141 lb (64 kg)  03/06/21 141 lb 6.4 oz (64.1 kg)  01/30/21 144 lb (65.3 kg)     She was last seen for hypertension 5 months ago by Osvaldo Angst, PA.  BP at that visit was 147/96. Management since that visit includes starting on Amlodipine 5mg  daily.  She reports good compliance with treatment. She is not having side effects.  She is following a Regular diet. She is exercising. She does not smoke.  Use of agents associated with hypertension: none.   Outside blood pressures are not being checked. Symptoms: No chest pain No chest pressure  No palpitations No syncope  No dyspnea No orthopnea  No paroxysmal nocturnal dyspnea No lower extremity edema   Pertinent labs: Lab Results  Component Value Date   CHOL 248 (H) 01/30/2021   HDL 52 01/30/2021   LDLCALC 166 (H) 01/30/2021   TRIG 167 (H) 01/30/2021   CHOLHDL 4.8 (H) 01/30/2021   Lab Results  Component Value Date   NA 140 08/03/2020   K 4.1 08/03/2020   CREATININE 0.56 (L) 08/03/2020   GFRNONAA 120 08/03/2020   GFRAA 139 08/03/2020   GLUCOSE 85 08/03/2020     The ASCVD Risk score (Goff DC Jr., et al., 2013) failed to calculate for the following reasons:   The 2013 ASCVD risk score is only valid for ages 57 to 64       Medications: Outpatient Medications Prior to Visit  Medication Sig   ACZONE 7.5 % GEL Apply 1 application topically daily. To face   Drospirenone (SLYND) 4 MG TABS Take 1 tablet by mouth daily.   Multiple Vitamin (MULTIVITAMIN) tablet Take 1 tablet by  mouth daily.   Omega-3 Fatty Acids (FISH OIL PO) Take by mouth daily.   [DISCONTINUED] amLODipine (NORVASC) 5 MG tablet TAKE 1 TABLET(5 MG) BY MOUTH DAILY   No facility-administered medications prior to visit.    Review of Systems  Constitutional:  Negative for activity change and fatigue.  Respiratory:  Negative for cough and shortness of breath.   Musculoskeletal:  Negative for arthralgias and myalgias.  Neurological:  Negative for dizziness, light-headedness and headaches.  Psychiatric/Behavioral:  Negative for agitation, self-injury, sleep disturbance and suicidal ideas. The patient is not nervous/anxious.       Objective    BP (!) 127/93   Pulse 69   Temp 98.6 F (37 C)   Resp 16   Ht 5\' 4"  (1.626 m)   Wt 141 lb (64 kg)   BMI 24.20 kg/m     Physical Exam Vitals reviewed.  Constitutional:      General: She is not in acute distress.    Appearance: Normal appearance. She is well-developed. She is not diaphoretic.  HENT:     Head: Normocephalic and atraumatic.  Eyes:     General: No scleral icterus.    Conjunctiva/sclera: Conjunctivae normal.  Neck:     Thyroid: No thyromegaly.  Cardiovascular:     Rate and Rhythm: Normal rate and regular rhythm.  Pulses: Normal pulses.     Heart sounds: Normal heart sounds. No murmur heard. Pulmonary:     Effort: Pulmonary effort is normal. No respiratory distress.     Breath sounds: Normal breath sounds. No wheezing, rhonchi or rales.  Musculoskeletal:     Cervical back: Neck supple.     Right lower leg: No edema.     Left lower leg: No edema.  Lymphadenopathy:     Cervical: No cervical adenopathy.  Skin:    General: Skin is warm and dry.     Findings: No rash.  Neurological:     Mental Status: She is alert and oriented to person, place, and time. Mental status is at baseline.  Psychiatric:        Mood and Affect: Mood normal.        Behavior: Behavior normal.      No results found for any visits on 07/23/21.   Assessment & Plan     Problem List Items Addressed This Visit       Cardiovascular and Mediastinum   Primary hypertension - Primary    Well controlled Continue current medications Recheck metabolic panel F/u in 6 months        Relevant Medications   amLODipine (NORVASC) 5 MG tablet   Other Relevant Orders   Basic Metabolic Panel (BMET)     Return in about 6 months (around 01/23/2022) for CPE, With new PCP.      I, Shirlee Latch, MD, have reviewed all documentation for this visit. The documentation on 07/23/21 for the exam, diagnosis, procedures, and orders are all accurate and complete.   Ramonita Koenig, Marzella Schlein, MD, MPH [facility]

## 2021-07-23 NOTE — Assessment & Plan Note (Signed)
Well controlled Continue current medications Recheck metabolic panel F/u in 6 months  

## 2021-07-24 LAB — BASIC METABOLIC PANEL
BUN/Creatinine Ratio: 16 (ref 9–23)
BUN: 11 mg/dL (ref 6–20)
CO2: 24 mmol/L (ref 20–29)
Calcium: 9.8 mg/dL (ref 8.7–10.2)
Chloride: 103 mmol/L (ref 96–106)
Creatinine, Ser: 0.7 mg/dL (ref 0.57–1.00)
Glucose: 94 mg/dL (ref 65–99)
Potassium: 4.6 mmol/L (ref 3.5–5.2)
Sodium: 141 mmol/L (ref 134–144)
eGFR: 114 mL/min/{1.73_m2} (ref >59)

## 2021-07-29 ENCOUNTER — Telehealth: Payer: Self-pay | Admitting: Family Medicine

## 2021-07-29 ENCOUNTER — Other Ambulatory Visit: Payer: Self-pay | Admitting: Family Medicine

## 2021-07-29 DIAGNOSIS — I10 Essential (primary) hypertension: Secondary | ICD-10-CM

## 2021-07-29 NOTE — Telephone Encounter (Signed)
Copied from CRM [id number]. Topic: Quick Communication - Lab Results (Clinic Use ONLY) >> Jul 26, 2021  3:19 PM Paschal Dopp, CMA wrote: Called patient to inform them of  lab results. When patient returns call, triage nurse may disclose results.      Pt returning call to get lab results. Held for NT with no response. Pt is requesting to have a call back and states that she is on Vacation and to leave a VM. Please advise.

## 2021-07-29 NOTE — Telephone Encounter (Signed)
Call to patient:  Message left on VM as requested: Labs are normal per PCP.

## 2021-08-02 ENCOUNTER — Encounter: Payer: Managed Care, Other (non HMO) | Admitting: Obstetrics and Gynecology

## 2021-08-09 ENCOUNTER — Encounter: Payer: BC Managed Care – PPO | Admitting: Obstetrics and Gynecology

## 2021-08-13 NOTE — Progress Notes (Signed)
GYNECOLOGY ANNUAL PHYSICAL EXAM PROGRESS NOTE  Subjective:   Brooke Koch is a 35 y.o. G85P1001 female with h/o HTN and dyslipidemia who presents for an annual exam.  The patient is sexually active.  The patient wears seatbelts: yes. The patient participates in regular exercise: yes. Has the patient ever been transfused or tattooed?:Professional tattoos, no transfusion. The patient reports that there is not domestic violence in her life.    The patient has the following complaints today: NONE    Gynecologic History Patient's last menstrual period was 08/11/2021.  Menstrual cycles are irregular since starting Slynd ~ 6 months ago.  Notes that it skips 2 months at a time.  History of STI's: Denies Last Pap: 08/03/2020 Results were: normal.  Denies h/o abnormal pap smears. Contraception: progesterone-only OCP.    Menstrual History: Menarche age: 63 Patient's last menstrual period was 08/11/2021 (exact date). Period Duration (Days): 3-4 Period Pattern: (!) Irregular Menstrual Flow: Light Menstrual Control: Panty liner Menstrual Control Change Freq (Hours): 6 Dysmenorrhea: None      Upstream - 08/14/21 1115       Pregnancy Intention Screening   Does the patient want to become pregnant in the next year? No    Does the patient's partner want to become pregnant in the next year? No    Would the patient like to discuss contraceptive options today? No      Contraception Wrap Up   Current Method Oral Contraceptive    End Method Oral Contraceptive    Contraception Counseling Provided No            The pregnancy intention screening data noted above was reviewed. Potential methods of contraception were discussed. The patient elected to proceed with Oral Contraceptive.   OB History  Gravida Para Term Preterm AB Living  1 1 1  0 0 1  SAB IAB Ectopic Multiple Live Births  0 0 0 0 1    # Outcome Date GA Lbr Len/2nd Weight Sex Delivery Anes PTL Lv  1 Term 06/09/11 [redacted]w[redacted]d   5 lb 10 oz (2.551 kg) F Vag-Spont   LIV     Birth Comments: Cleft Hand     Past Medical History:  Diagnosis Date   Gastritis    History of Helicobacter pylori infection    Wears contact lenses     Past Surgical History:  Procedure Laterality Date   COLONOSCOPY WITH PROPOFOL N/A 10/29/2020   Procedure: COLONOSCOPY WITH BIOPSY;  Surgeon: 13/07/2020, MD;  Location: [facility];  Service: Endoscopy;  Laterality: N/A;   ESOPHAGOGASTRODUODENOSCOPY  12/2009    Family History  Problem Relation Age of Onset   Hypertension Mother    Glaucoma Mother    Hyperlipidemia Mother    Hypertension Father    Diabetes Father    Prostate cancer Father        cancer free x 1 year   Breast cancer Cousin    Breast cancer Cousin     Social History   Socioeconomic History   Marital status: Single    Spouse name: Not on file   Number of children: Not on file   Years of education: Not on file   Highest education level: Not on file  Occupational History   Not on file  Tobacco Use   Smoking status: Never   Smokeless tobacco: Never  Vaping Use   Vaping Use: Never used  Substance and Sexual Activity   Alcohol use: Yes    Alcohol/week: 0.0 standard  drinks    Comment: social drinker   Drug use: No   Sexual activity: Yes  Other Topics Concern   Not on file  Social History Narrative   Not on file   Social Determinants of Health   Financial Resource Strain: Not on file  Food Insecurity: Not on file  Transportation Needs: Not on file  Physical Activity: Not on file  Stress: Not on file  Social Connections: Not on file  Intimate Partner Violence: Not on file    Current Outpatient Medications on File Prior to Visit  Medication Sig Dispense Refill   ACZONE 7.5 % GEL Apply 1 application topically daily. To face  1   amLODipine (NORVASC) 5 MG tablet TAKE 1 TABLET(5 MG) BY MOUTH DAILY 90 tablet 1   Drospirenone (SLYND) 4 MG TABS Take 1 tablet by mouth daily. 84 tablet 3   Multiple  Vitamin (MULTIVITAMIN) tablet Take 1 tablet by mouth daily.     Omega-3 Fatty Acids (FISH OIL PO) Take by mouth daily.     No current facility-administered medications on file prior to visit.    Allergies  Allergen Reactions   Septra [Sulfamethoxazole-Trimethoprim] Hives     Review of Systems Constitutional: negative for chills, fatigue, fevers and sweats Eyes: negative for irritation, redness and visual disturbance Ears, nose, mouth, throat, and face: negative for hearing loss, nasal congestion, snoring and tinnitus Respiratory: negative for asthma, cough, sputum Cardiovascular: negative for chest pain, dyspnea, exertional chest pressure/discomfort, irregular heart beat, palpitations and syncope Gastrointestinal: negative for abdominal pain, change in bowel habits, nausea and vomiting Genitourinary: positive for irregular  menstrual periods (skips 2 months at a time). Negative for genital lesions, sexual problems and vaginal discharge, dysuria and urinary incontinence Integument/breast: negative for breast lump, breast tenderness and nipple discharge Hematologic/lymphatic: negative for bleeding and easy bruising Musculoskeletal:negative for back pain and muscle weakness Neurological: negative for dizziness, headaches, vertigo and weakness Endocrine: negative for diabetic symptoms including polydipsia, polyuria and skin dryness Allergic/Immunologic: negative for hay fever and urticaria      Objective:   Blood pressure 121/79, pulse 65, resp. rate 16, height 5' 3.25" (1.607 m), weight 143 lb (64.9 kg), last menstrual period 08/11/2021. Body mass index is 25.13 kg/m.   General Appearance:    Alert, cooperative, no distress, appears stated age  Head:    Normocephalic, without obvious abnormality, atraumatic  Eyes:    PERRL, conjunctiva/corneas clear, EOM's intact, both eyes  Ears:    Normal external ear canals, both ears  Nose:   Nares normal, septum midline, mucosa normal, no  drainage or sinus tenderness  Throat:   Lips, mucosa, and tongue normal; teeth and gums normal  Neck:   Supple, symmetrical, trachea midline, no adenopathy; thyroid: no enlargement/tenderness/nodules; no carotid bruit or JVD  Back:     Symmetric, no curvature, ROM normal, no CVA tenderness  Lungs:     Clear to auscultation bilaterally, respirations unlabored  Chest Wall:    No tenderness or deformity   Heart:    Regular rate and rhythm, S1 and S2 normal, no murmur, rub or gallop  Breast Exam:    No tenderness, masses, or nipple abnormality  Abdomen:     Soft, non-tender, bowel sounds active all four quadrants, no masses, no organomegaly.    Genitalia:    Pelvic:external genitalia normal, vagina without lesions, discharge, or tenderness, rectovaginal septum  normal. Cervix normal in appearance, no cervical motion tenderness, no adnexal masses or tenderness.  Uterus normal size,  shape, mobile, regular contours, nontender.  Rectal:    Normal external sphincter.  No hemorrhoids appreciated. Internal exam not done.   Extremities:   Extremities normal, atraumatic, no cyanosis or edema  Pulses:   2+ and symmetric all extremities  Skin:   Skin color, texture, turgor normal, no rashes or lesions  Lymph nodes:   Cervical, supraclavicular, and axillary nodes normal  Neurologic:   CNII-XII intact, normal strength, sensation and reflexes throughout   .  Labs:  Lab Results  Component Value Date   WBC 4.7 08/03/2020   HGB 13.2 08/03/2020   HCT 41.3 08/03/2020   MCV 83 08/03/2020   PLT 322 08/03/2020    Lab Results  Component Value Date   CREATININE 0.70 07/23/2021   BUN 11 07/23/2021   NA 141 07/23/2021   K 4.6 07/23/2021   CL 103 07/23/2021   CO2 24 07/23/2021    Lab Results  Component Value Date   ALT 19 08/03/2020   AST 22 08/03/2020   ALKPHOS 49 08/03/2020   BILITOT 0.3 08/03/2020    Lab Results  Component Value Date   TSH 2.190 08/10/2019    Lab Results  Component Value  Date   CHOL 248 (H) 01/30/2021   HDL 52 01/30/2021   LDLCALC 166 (H) 01/30/2021   TRIG 167 (H) 01/30/2021   CHOLHDL 4.8 (H) 01/30/2021    Assessment:   1. Well woman exam with routine gynecological exam   2. Essential hypertension   3. Dyslipidemia   4. Surveillance for birth control, oral contraceptives      Plan:  Blood tests: CBC with diff, Lipoproteins, and hepatic panel. Breast self exam technique reviewed and patient encouraged to perform self-exam monthly. Contraception: oral progesterone-only contraceptive.  Discussed that irregularity in her cycle can occur with current OCP (Slynd).  Not bothersome to patient.   Discussed healthy lifestyle modifications. Mammogram: to begin breast screenings at age 56.  Pap smear up to date. To repeat in 2024. COVID vaccination status: UTD Dyslipidemia, managed by PCP. Currently on no meds, attempting lifestyle interventions. HTN managed with Norvasc.  Follow up in 1 year for annual exam   Hildred Laser, MD [facility]

## 2021-08-14 ENCOUNTER — Ambulatory Visit (INDEPENDENT_AMBULATORY_CARE_PROVIDER_SITE_OTHER): Payer: BC Managed Care – PPO | Admitting: Obstetrics and Gynecology

## 2021-08-14 ENCOUNTER — Encounter: Payer: Self-pay | Admitting: Obstetrics and Gynecology

## 2021-08-14 ENCOUNTER — Other Ambulatory Visit: Payer: Self-pay

## 2021-08-14 VITALS — BP 121/79 | HR 65 | Resp 16 | Ht 63.25 in | Wt 143.0 lb

## 2021-08-14 DIAGNOSIS — Z01419 Encounter for gynecological examination (general) (routine) without abnormal findings: Secondary | ICD-10-CM | POA: Diagnosis not present

## 2021-08-14 DIAGNOSIS — E785 Hyperlipidemia, unspecified: Secondary | ICD-10-CM

## 2021-08-14 DIAGNOSIS — Z3041 Encounter for surveillance of contraceptive pills: Secondary | ICD-10-CM | POA: Diagnosis not present

## 2021-08-14 DIAGNOSIS — I10 Essential (primary) hypertension: Secondary | ICD-10-CM

## 2021-08-14 NOTE — Patient Instructions (Signed)
Preventive Care 21-37 Years Old, Female Preventive care refers to lifestyle choices and visits with your health care provider that can promote health and wellness. This includes: A yearly physical exam. This is also called an annual wellness visit. Regular dental and eye exams. Immunizations. Screening for certain conditions. Healthy lifestyle choices, such as: Eating a healthy diet. Getting regular exercise. Not using drugs or products that contain nicotine and tobacco. Limiting alcohol use. What can I expect for my preventive care visit? Physical exam Your health care provider may check your: Height and weight. These may be used to calculate your BMI (body mass index). BMI is a measurement that tells if you are at a healthy weight. Heart rate and blood pressure. Body temperature. Skin for abnormal spots. Counseling Your health care provider may ask you questions about your: Past medical problems. Family's medical history. Alcohol, tobacco, and drug use. Emotional well-being. Home life and relationship well-being. Sexual activity. Diet, exercise, and sleep habits. Work and work environment. Access to firearms. Method of birth control. Menstrual cycle. Pregnancy history. What immunizations do I need?  Vaccines are usually given at various ages, according to a schedule. Your health care provider will recommend vaccines for you based on your age, medicalhistory, and lifestyle or other factors, such as travel or where you work. What tests do I need?  Blood tests Lipid and cholesterol levels. These may be checked every 5 years starting at age 20. Hepatitis C test. Hepatitis B test. Screening Diabetes screening. This is done by checking your blood sugar (glucose) after you have not eaten for a while (fasting). STD (sexually transmitted disease) testing, if you are at risk. BRCA-related cancer screening. This may be done if you have a family history of breast, ovarian, tubal, or  peritoneal cancers. Pelvic exam and Pap test. This may be done every 3 years starting at age 21. Starting at age 30, this may be done every 5 years if you have a Pap test in combination with an HPV test. Talk with your health care provider about your test results, treatment options,and if necessary, the need for more tests. Follow these instructions at home: Eating and drinking  Eat a healthy diet that includes fresh fruits and vegetables, whole grains, lean protein, and low-fat dairy products. Take vitamin and mineral supplements as recommended by your health care provider. Do not drink alcohol if: Your health care provider tells you not to drink. You are pregnant, may be pregnant, or are planning to become pregnant. If you drink alcohol: Limit how much you have to 0-1 drink a day. Be aware of how much alcohol is in your drink. In the [place] one drink equals one 12 oz bottle of beer (355 mL), one 5 oz glass of wine (148 mL), or one 1 oz glass of hard liquor (44 mL).  Lifestyle Take daily care of your teeth and gums. Brush your teeth every morning and night with fluoride toothpaste. Floss one time each day. Stay active. Exercise for at least 30 minutes 5 or more days each week. Do not use any products that contain nicotine or tobacco, such as cigarettes, e-cigarettes, and chewing tobacco. If you need help quitting, ask your health care provider. Do not use drugs. If you are sexually active, practice safe sex. Use a condom or other form of protection to prevent STIs (sexually transmitted infections). If you do not wish to become pregnant, use a form of birth control. If you plan to become pregnant, see your health care   provider for a prepregnancy visit. Find healthy ways to cope with stress, such as: Meditation, yoga, or listening to music. Journaling. Talking to a trusted person. Spending time with friends and family. Safety Always wear your seat belt while driving or riding in a  vehicle. Do not drive: If you have been drinking alcohol. Do not ride with someone who has been drinking. When you are tired or distracted. While texting. Wear a helmet and other protective equipment during sports activities. If you have firearms in your house, make sure you follow all gun safety procedures. Seek help if you have been physically or sexually abused. What's next? Go to your health care provider once a year for an annual wellness visit. Ask your health care provider how often you should have your eyes and teeth checked. Stay up to date on all vaccines. This information is not intended to replace advice given to you by your health care provider. Make sure you discuss any questions you have with your healthcare provider. Document Revised: 08/05/2020 Document Reviewed: 08/19/2018 Elsevier Patient Education  2022 Elsevier Inc. Breast Self-Awareness Breast self-awareness is knowing how your breasts look and feel. Doing breast self-awareness is important. It allows you to catch a breast problem early while it is still small and can be treated. All women should do breast self-awareness, including women who have had breast implants. Tell your doctorif you notice a change in your breasts. What you need: A mirror. A well-lit room. How to do a breast self-exam A breast self-exam is one way to learn what is normal for your breasts and tocheck for changes. To do a breast self-exam: Look for changes  Take off all the clothes above your waist. Stand in front of a mirror in a room with good lighting. Put your hands on your hips. Push your hands down. Look at your breasts and nipples in the mirror to see if one breast or nipple looks different from the other. Check to see if: The shape of one breast is different. The size of one breast is different. There are wrinkles, dips, and bumps in one breast and not the other. Look at each breast for changes in the skin, such as: Redness. Scaly  areas. Look for changes in your nipples, such as: Liquid around the nipples. Bleeding. Dimpling. Redness. A change in where the nipples are.  Feel for changes  Lie on your back on the floor. Feel each breast. To do this, follow these steps: Pick a breast to feel. Put the arm closest to that breast above your head. Use your other arm to feel the nipple area of your breast. Feel the area with the pads of your three middle fingers by making small circles with your fingers. For the first circle, press lightly. For the second circle, press harder. For the third circle, press even harder. Keep making circles with your fingers at the different pressures as you move down your breast. Stop when you feel your ribs. Move your fingers a little toward the center of your body. Start making circles with your fingers again, this time going up until you reach your collarbone. Keep making up-and-down circles until you reach your armpit. Remember to keep using the three pressures. Feel the other breast in the same way. Sit or stand in the tub or shower. With soapy water on your skin, feel each breast the same way you did in step 2 when you were lying on the floor.  Write down what you find   Writing down what you find can help you remember what to tell your doctor. Write down: What is normal for each breast. Any changes you find in each breast, including: The kind of changes you find. Whether you have pain. Size and location of any lumps. When you last had your menstrual period. General tips Check your breasts every month. If you are breastfeeding, the best time to check your breasts is after you feed your baby or after you use a breast pump. If you get menstrual periods, the best time to check your breasts is 5-7 days after your menstrual period is over. With time, you will become comfortable with the self-exam, and you will begin to know if there are changes in your breasts. Contact a doctor if  you: See a change in the shape or size of your breasts or nipples. See a change in the skin of your breast or nipples, such as red or scaly skin. Have fluid coming from your nipples that is not normal. Find a lump or thick area that was not there before. Have pain in your breasts. Have any concerns about your breast health. Summary Breast self-awareness includes looking for changes in your breasts, as well as feeling for changes within your breasts. Breast self-awareness should be done in front of a mirror in a well-lit room. You should check your breasts every month. If you get menstrual periods, the best time to check your breasts is 5-7 days after your menstrual period is over. Let your doctor know of any changes you see in your breasts, including changes in size, changes on the skin, pain or tenderness, or fluid from your nipples that is not normal. This information is not intended to replace advice given to you by your health care provider. Make sure you discuss any questions you have with your healthcare provider. Document Revised: 07/27/2018 Document Reviewed: 07/27/2018 Elsevier Patient Education  2022 Elsevier Inc.  

## 2021-08-15 LAB — HEPATIC FUNCTION PANEL
ALT: 19 IU/L (ref 0–32)
AST: 22 IU/L (ref 0–40)
Albumin: 5.2 g/dL — ABNORMAL HIGH (ref 3.8–4.8)
Alkaline Phosphatase: 54 IU/L (ref 44–121)
Bilirubin Total: 0.4 mg/dL (ref 0.0–1.2)
Bilirubin, Direct: 0.12 mg/dL (ref 0.00–0.40)
Total Protein: 7.4 g/dL (ref 6.0–8.5)

## 2021-08-15 LAB — LIPID PANEL
Chol/HDL Ratio: 4.1 ratio (ref 0.0–4.4)
Cholesterol, Total: 244 mg/dL — ABNORMAL HIGH (ref 100–199)
HDL: 59 mg/dL (ref >39)
LDL Chol Calc (NIH): 174 mg/dL — ABNORMAL HIGH (ref 0–99)
Triglycerides: 66 mg/dL (ref 0–149)
VLDL Cholesterol Cal: 11 mg/dL (ref 5–40)

## 2021-08-15 LAB — CBC
Hematocrit: 43.5 % (ref 34.0–46.6)
Hemoglobin: 14.1 g/dL (ref 11.1–15.9)
MCH: 27.2 pg (ref 26.6–33.0)
MCHC: 32.4 g/dL (ref 31.5–35.7)
MCV: 84 fL (ref 79–97)
Platelets: 348 10*3/uL (ref 150–450)
RBC: 5.19 x10E6/uL (ref 3.77–5.28)
RDW: 12.6 % (ref 11.7–15.4)
WBC: 5.7 10*3/uL (ref 3.4–10.8)

## 2021-09-02 ENCOUNTER — Other Ambulatory Visit: Payer: Self-pay

## 2021-09-02 ENCOUNTER — Ambulatory Visit (INDEPENDENT_AMBULATORY_CARE_PROVIDER_SITE_OTHER): Payer: BC Managed Care – PPO | Admitting: Dermatology

## 2021-09-02 DIAGNOSIS — L918 Other hypertrophic disorders of the skin: Secondary | ICD-10-CM

## 2021-09-02 DIAGNOSIS — L700 Acne vulgaris: Secondary | ICD-10-CM

## 2021-09-02 MED ORDER — CLINDAMYCIN-TRETINOIN 1.2-0.025 % EX GEL: CUTANEOUS | 3 refills | Status: DC

## 2021-09-02 MED ORDER — DAPSONE 7.5 % EX GEL: CUTANEOUS | 3 refills | Status: DC

## 2021-09-02 NOTE — Patient Instructions (Addendum)
Topical retinoid medications like tretinoin/Retin-A, adapalene/Differin, tazarotene/Fabior, and Epiduo/Epiduo Forte can cause dryness and irritation when first started. Only apply a pea-sized amount to the entire affected area. Avoid applying it around the eyes, edges of mouth and creases at the nose. If you experience irritation, use a good moisturizer first and/or apply the medicine less often. If you are doing well with the medicine, you can increase how often you use it until you are applying every night. Be careful with sun protection while using this medication as it can make you sensitive to the sun. This medicine should not be used by pregnant women.        If you have any questions or concerns for your doctor, please call our main line at [phone] and press option 4 to reach your doctor's medical assistant. If no one answers, please leave a voicemail as directed and we will return your call as soon as possible. Messages left after 4 pm will be answered the following business day.   You may also send us a message via MyChart. We typically respond to MyChart messages within 1-2 business days.  For prescription refills, please ask your pharmacy to contact our office. Our fax number is [phone].  If you have an urgent issue when the clinic is closed that cannot wait until the next business day, you can page your doctor at the number below.    Please note that while we do our best to be available for urgent issues outside of office hours, we are not available 24/7.   If you have an urgent issue and are unable to reach us, you may choose to seek medical care at your doctor's office, retail clinic, urgent care center, or emergency room.  If you have a medical emergency, please immediately call 911 or go to the emergency department.  Pager Numbers  - Dr. Kowalski: [phone]  - Dr. Moye: [phone]  - Dr. Stewart: [phone]  In the event of inclement weather, please call  our main line at [phone] for an update on the status of any delays or closures.  Dermatology Medication Tips: Please keep the boxes that topical medications come in in order to help keep track of the instructions about where and how to use these. Pharmacies typically print the medication instructions only on the boxes and not directly on the medication tubes.   If your medication is too expensive, please contact our office at [phone] option 4 or send us a message through MyChart.   We are unable to tell what your co-pay for medications will be in advance as this is different depending on your insurance coverage. However, we may be able to find a substitute medication at lower cost or fill out paperwork to get insurance to cover a needed medication.   If a prior authorization is required to get your medication covered by your insurance company, please allow us 1-2 business days to complete this process.  Drug prices often vary depending on where the prescription is filled and some pharmacies may offer cheaper prices.  The website www.goodrx.com contains coupons for medications through different pharmacies. The prices here do not account for what the cost may be with help from insurance (it may be cheaper with your insurance), but the website can give you the price if you did not use any insurance.  - You can print the associated coupon and take it with your prescription to the pharmacy.  - You may also stop by our office during   regular business hours and pick up a GoodRx coupon card.  - If you need your prescription sent electronically to a different pharmacy, notify our office through [facility] MyChart or by phone at [phone] option 4.  

## 2021-09-02 NOTE — Progress Notes (Signed)
   New Patient Visit  Subjective  Brooke Koch is a 35 y.o. female who presents for the following: Acne (Face x years. She was on Accutane in the past, around 10+ years ago. More recently, she was on Aczone 7.5% Gel and Veltin Gel, but has run out.) and Tags (Neck x years. Get caught on clothing, necklaces. ). Topical Rxs worked for her acne and she would like rfs.   The following portions of the chart were reviewed this encounter and updated as appropriate:       Review of Systems:  No other skin or systemic complaints except as noted in HPI or Assessment and Plan.  Objective  Well appearing patient in no apparent distress; mood and affect are within normal limits.  A focused examination was performed including face, neck. Relevant physical exam findings are noted in the Assessment and Plan.  face Closed comedones on chin and forehead; scarring on chin; inflammatory papule on the glabella, right cheek.   Assessment & Plan  Acrochordons (Skin Tags) - Removal desired by patient - Fleshy, skin-colored pedunculated papules - Benign appearing.  - Patient desires removal. Reviewed that this is not covered by insurance and they will be charged a cosmetic fee for removal. Patient signed non-covered consent.  - Prior to the procedure, reviewed the expected small wound. Also reviewed the risk of leaving a small scar and the small risk of infection.  PROCEDURE - The areas were prepped with isopropyl alcohol. A small amount of lidocaine 1% with epinephrine was injected at the base of each lesion to achieve good local anesthesia. The skin tags were removed using a snip technique and ED. Aluminum chloride was used for hemostasis. Petrolatum and a bandage were applied. The procedure was tolerated well. - Wound care was reviewed with the patient. They were advised to call with any concerns. Total number of treated acrochordons 15 (L and R neck)  Acne vulgaris face  Restart Aczone 7.5% Gel  Apply to face qam dsp 90g 3Rf. Restart Clindamycin-tretinoin gel (Ziana) Gel Apply to face qhs as tolerated dsp 60g 3Rf.  Topical retinoid medications like tretinoin/Retin-A, adapalene/Differin, tazarotene/Fabior, and Epiduo/Epiduo Forte can cause dryness and irritation when first started. Only apply a pea-sized amount to the entire affected area. Avoid applying it around the eyes, edges of mouth and creases at the nose. If you experience irritation, use a good moisturizer first and/or apply the medicine less often. If you are doing well with the medicine, you can increase how often you use it until you are applying every night. Be careful with sun protection while using this medication as it can make you sensitive to the sun. This medicine should not be used by pregnant women.    clindamycin-tretinoin (ZIANA) gel - face Apply a pea-sized amount to face every night as tolerated for acne.  Dapsone (ACZONE) 7.5 % GEL - face Apply to face every morning for acne. Return in 1 year (on 09/02/2022) for acne.  Brooke Koch, CMA, am acting as scribe for Willeen Niece, MD .  Documentation: I have reviewed the above documentation for accuracy and completeness, and I agree with the above.  Willeen Niece MD

## 2021-09-04 ENCOUNTER — Telehealth: Payer: Self-pay

## 2021-09-04 NOTE — Telephone Encounter (Signed)
Fax from pharmacy stating that dapsone may result in a cross-sensitivity reaction based on a history of allergy to sulfa antibiotics. Please advise.

## 2021-09-17 ENCOUNTER — Telehealth: Payer: Self-pay

## 2021-09-17 DIAGNOSIS — L700 Acne vulgaris: Secondary | ICD-10-CM

## 2021-09-17 NOTE — Telephone Encounter (Signed)
Ziana gel not covered by insurance. Fax states that the combination is not clinically necessary and is a convenience.

## 2021-09-18 MED ORDER — TRETINOIN 0.025 % EX CREA: TOPICAL_CREAM | Freq: Every day | CUTANEOUS | 2 refills | Status: DC

## 2021-09-18 MED ORDER — CLINDAMYCIN PHOSPHATE 1 % EX LOTN: TOPICAL_LOTION | Freq: Every morning | CUTANEOUS | 2 refills | Status: DC

## 2021-09-18 NOTE — Addendum Note (Signed)
Addended by: Epifania Gore on: 09/18/2021 08:08 AM   Modules accepted: Orders

## 2021-10-29 ENCOUNTER — Telehealth: Payer: Self-pay | Admitting: Obstetrics and Gynecology

## 2021-10-29 NOTE — Telephone Encounter (Signed)
Pt is calling in stating that she thinks she has a bacterial infection some of her symptoms are (discharge,odor and itching) and would like to be seen by Dr. Valentino Saxon there are no opening slots. Where would you like for [place] to schedule her?

## 2021-10-30 NOTE — Telephone Encounter (Signed)
Called the pt lmom for pt to call the office to get scheduled if she is still needing our assistance with this matter.

## 2021-11-28 ENCOUNTER — Other Ambulatory Visit: Payer: Self-pay

## 2021-11-28 DIAGNOSIS — L700 Acne vulgaris: Secondary | ICD-10-CM

## 2021-11-28 MED ORDER — DAPSONE 7.5 % EX GEL: CUTANEOUS | 3 refills | Status: DC

## 2021-11-28 NOTE — Progress Notes (Signed)
Patient has new insurance. She would like for the Aczone to be sent in again. RX sent in and patient agreed to [place] [facility] for better pricing.

## 2022-01-23 ENCOUNTER — Encounter: Payer: BC Managed Care – PPO | Admitting: Family Medicine

## 2022-01-28 ENCOUNTER — Telehealth: Payer: Self-pay | Admitting: Family Medicine

## 2022-01-28 DIAGNOSIS — I10 Essential (primary) hypertension: Secondary | ICD-10-CM

## 2022-01-28 NOTE — Telephone Encounter (Signed)
Requested by interface surescripts. Last refill 01/01/22 #90 1 refill.  Requested Prescriptions  Refused Prescriptions Disp Refills   amLODipine (NORVASC) 5 MG tablet [Pharmacy Med Name: AMLODIPINE BESYLATE 5MG  TABLETS] 90 tablet 1    Sig: TAKE 1 TABLET(5 MG) BY MOUTH DAILY     Cardiovascular: Calcium Channel Blockers 2 Failed - 01/28/2022  3:38 AM      Failed - Valid encounter within last 6 months    Recent Outpatient Visits          6 months ago Primary hypertension   [facility] [place], Dionne Bucy, [place]   10 months ago Primary hypertension   [facility] [place], [place]      Future Appointments            In 1 week Gwyneth Sprout, [place], [place]   In 7 months Brendolyn Patty, MD [place] BP in normal range    BP Readings from Last 1 Encounters:  08/14/21 121/79         Passed - Last Heart Rate in normal range    Pulse Readings from Last 1 Encounters:  08/14/21 65

## 2022-02-05 NOTE — Progress Notes (Signed)
Complete physical exam   Patient: Analy Koch   DOB: Jun 06, 1986   35 y.o. Female  MRN: [id number] Visit Date: 02/06/2022  Today's healthcare provider: Jacky Kindle, FNP   No chief complaint on file.  Subjective    Brooke Koch is a 36 y.o. female who presents today for a complete physical exam.  She reports consuming a general diet. She does exercise. She generally feels well. She reports sleeping well. She does not have additional problems to discuss today.  HPI    Past Medical History:  Diagnosis Date   Gastritis    History of Helicobacter pylori infection    Wears contact lenses    Past Surgical History:  Procedure Laterality Date   COLONOSCOPY WITH PROPOFOL N/A 10/29/2020   Procedure: COLONOSCOPY WITH BIOPSY;  Surgeon: Midge Minium, MD;  Location: [facility];  Service: Endoscopy;  Laterality: N/A;   ESOPHAGOGASTRODUODENOSCOPY  12/2009   Social History   Socioeconomic History   Marital status: Single    Spouse name: Not on file   Number of children: Not on file   Years of education: Not on file   Highest education level: Not on file  Occupational History   Not on file  Tobacco Use   Smoking status: Never   Smokeless tobacco: Never  Vaping Use   Vaping Use: Never used  Substance and Sexual Activity   Alcohol use: Yes    Alcohol/week: 0.0 standard drinks    Comment: social drinker   Drug use: No   Sexual activity: Yes  Other Topics Concern   Not on file  Social History Narrative   Not on file   Social Determinants of Health   Financial Resource Strain: Not on file  Food Insecurity: Not on file  Transportation Needs: Not on file  Physical Activity: Not on file  Stress: Not on file  Social Connections: Not on file  Intimate Partner Violence: Not on file   Family Status  Relation Name Status   Mother  Alive   Father  Alive   Cousin  Alive   Cousin  Alive   Family History  Problem Relation Age of Onset   Hypertension Mother     Glaucoma Mother    Hyperlipidemia Mother    Hypertension Father    Diabetes Father    Prostate cancer Father        cancer free x 1 year   Breast cancer Cousin    Breast cancer Cousin    Allergies  Allergen Reactions   Septra [Sulfamethoxazole-Trimethoprim] Hives    Patient Care Team: Jacky Kindle, FNP as PCP - General (Family Medicine)   Medications: Outpatient Medications Prior to Visit  Medication Sig   amLODipine (NORVASC) 5 MG tablet TAKE 1 TABLET(5 MG) BY MOUTH DAILY   Dapsone (ACZONE) 7.5 % GEL Apply to face every morning for acne.   Drospirenone (SLYND) 4 MG TABS Take 1 tablet by mouth daily.   Multiple Vitamin (MULTIVITAMIN) tablet Take 1 tablet by mouth daily.   Omega-3 Fatty Acids (FISH OIL PO) Take by mouth daily.   [DISCONTINUED] clindamycin (CLEOCIN-T) 1 % lotion Apply topically in the morning.   [DISCONTINUED] clindamycin-tretinoin (ZIANA) gel Apply a pea-sized amount to face every night as tolerated for acne.   [DISCONTINUED] tretinoin (RETIN-A) 0.025 % cream Apply topically at bedtime. As tolerated.   No facility-administered medications prior to visit.    Review of Systems  Constitutional: Negative.   HENT: Negative.  Eyes: Negative.   Respiratory: Negative.    Cardiovascular: Negative.   Gastrointestinal: Negative.   Endocrine: Negative.   Genitourinary: Negative.   Musculoskeletal: Negative.   Skin: Negative.   Allergic/Immunologic: Negative.   Neurological:  Positive for headaches.  Hematological: Negative.   Psychiatric/Behavioral: Negative.       Objective    BP 139/76 (BP Location: Right Arm, Patient Position: Sitting, Cuff Size: Normal)    Pulse 80    Temp 98.8 F (37.1 C) (Oral)    Wt 147 lb (66.7 kg)    SpO2 99%    BMI 25.83 kg/m    Physical Exam Vitals and nursing note reviewed.  Constitutional:      General: She is awake. She is not in acute distress.    Appearance: Normal appearance. She is well-developed, well-groomed  and normal weight. She is not ill-appearing, toxic-appearing or diaphoretic.  HENT:     Head: Normocephalic and atraumatic.     Jaw: There is normal jaw occlusion. No trismus, tenderness, swelling or pain on movement.     Right Ear: Hearing, tympanic membrane, ear canal and external ear normal. There is no impacted cerumen.     Left Ear: Hearing, tympanic membrane, ear canal and external ear normal. There is no impacted cerumen.     Nose: Nose normal. No congestion or rhinorrhea.     Right Turbinates: Not enlarged, swollen or pale.     Left Turbinates: Not enlarged, swollen or pale.     Right Sinus: No maxillary sinus tenderness or frontal sinus tenderness.     Left Sinus: No maxillary sinus tenderness or frontal sinus tenderness.     Mouth/Throat:     Lips: Pink.     Mouth: Mucous membranes are moist. No injury.     Tongue: No lesions.     Pharynx: Oropharynx is clear. Uvula midline. No pharyngeal swelling, oropharyngeal exudate, posterior oropharyngeal erythema or uvula swelling.     Tonsils: No tonsillar exudate or tonsillar abscesses.  Eyes:     General: Lids are normal. Lids are everted, no foreign bodies appreciated. Vision grossly intact. Gaze aligned appropriately. No allergic shiner or visual field deficit.       Right eye: No discharge.        Left eye: No discharge.     Extraocular Movements: Extraocular movements intact.     Conjunctiva/sclera: Conjunctivae normal.     Right eye: Right conjunctiva is not injected. No exudate.    Left eye: Left conjunctiva is not injected. No exudate.    Pupils: Pupils are equal, round, and reactive to light.  Neck:     Thyroid: No thyroid mass, thyromegaly or thyroid tenderness.     Vascular: No carotid bruit.     Trachea: Trachea normal.  Cardiovascular:     Rate and Rhythm: Normal rate and regular rhythm.     Pulses: Normal pulses.          Carotid pulses are 2+ on the right side and 2+ on the left side.      Radial pulses are 2+ on  the right side and 2+ on the left side.       Dorsalis pedis pulses are 2+ on the right side and 2+ on the left side.       Posterior tibial pulses are 2+ on the right side and 2+ on the left side.     Heart sounds: Normal heart sounds, S1 normal and S2 normal. No murmur heard.  No friction rub. No gallop.  Pulmonary:     Effort: Pulmonary effort is normal. No respiratory distress.     Breath sounds: Normal breath sounds and air entry. No stridor. No wheezing, rhonchi or rales.  Chest:     Chest wall: No tenderness.     Comments: Breast exam deferred; discussed 'know your lemons' campaign and self exam Abdominal:     General: Abdomen is flat. Bowel sounds are normal. There is no distension.     Palpations: Abdomen is soft. There is no mass.     Tenderness: There is no abdominal tenderness. There is no right CVA tenderness, left CVA tenderness, guarding or rebound.     Hernia: No hernia is present.  Genitourinary:    Comments: Exam deferred; denies complaints Musculoskeletal:        General: No swelling, tenderness, deformity or signs of injury. Normal range of motion.     Cervical back: Full passive range of motion without pain, normal range of motion and neck supple. No edema, rigidity or tenderness. No muscular tenderness.     Right lower leg: No edema.     Left lower leg: No edema.  Lymphadenopathy:     Cervical: No cervical adenopathy.     Right cervical: No superficial, deep or posterior cervical adenopathy.    Left cervical: No superficial, deep or posterior cervical adenopathy.  Skin:    General: Skin is warm and dry.     Capillary Refill: Capillary refill takes less than 2 seconds.     Coloration: Skin is not jaundiced or pale.     Findings: No bruising, erythema, lesion or rash.  Neurological:     General: No focal deficit present.     Mental Status: She is alert and oriented to person, place, and time. Mental status is at baseline.     GCS: GCS eye subscore is 4. GCS  verbal subscore is 5. GCS motor subscore is 6.     Sensory: Sensation is intact. No sensory deficit.     Motor: Motor function is intact. No weakness.     Coordination: Coordination is intact. Coordination normal.     Gait: Gait is intact. Gait normal.  Psychiatric:        Attention and Perception: Attention and perception normal.        Mood and Affect: Mood and affect normal.        Speech: Speech normal.        Behavior: Behavior normal. Behavior is cooperative.        Thought Content: Thought content normal.        Cognition and Memory: Cognition and memory normal.        Judgment: Judgment normal.    Problem List Items Addressed This Visit       Cardiovascular and Mediastinum   Primary hypertension    Controlled, check metabolic panel, f/u in 1 year, continue medication Chronic, stable Denies CP Denies SOB Denies DOE No LE Edema noted on exam Continue medication Refills provided Seek emergent care if you develop CP, chest pain or chest pressure       Relevant Orders   Comprehensive metabolic panel   Lipid Panel With LDL/HDL Ratio   Chronic migraine without aura without status migrainosus, not intractable    Reports hx of migraines prior to birth of her daughter 10 years ago Hx of intermittent migraines 1-3 days in length Headache log provided to recommend finding trends/patterns      Relevant Orders  CBC with Differential/Platelet   Comprehensive metabolic panel   TSH + free T4     Other   Annual physical exam - Primary    UTD on dental Due for vision Things to do to keep yourself healthy  - Exercise at least 30-45 minutes a day, 3-4 days a week.  - Eat a low-fat diet with lots of fruits and vegetables, up to 7-9 servings per day.  - Seatbelts can save your life. Wear them always.  - Smoke detectors on every level of your home, check batteries every year.  - Eye Doctor - have an eye exam every 1-2 years  - Safe sex - if you may be exposed to STDs, use a  condom.  - Alcohol -  If you drink, do it moderately, less than 2 drinks per day.  - Health Care Power of Attorney. Choose someone to speak for you if you are not able.  - Depression is common in our stressful world.If you're feeling down or losing interest in things you normally enjoy, please come in for a visit.  - Violence - If anyone is threatening or hurting you, please call immediately.        Relevant Orders   Comprehensive metabolic panel   TSH + free T4   Encounter for screening for HIV   Relevant Orders   HIV Antibody (routine testing w rflx)   Encounter for hepatitis C screening test for low risk patient   Relevant Orders   Hepatitis C antibody   Menorrhagia with irregular cycle   Relevant Orders   CBC with Differential/Platelet   Elevated LDL cholesterol level   Relevant Orders   Lipid Panel With LDL/HDL Ratio     Last depression screening scores PHQ 2/9 Scores 02/06/2022 08/14/2021 03/06/2021  PHQ - 2 Score 0 0 0  PHQ- 9 Score 1 - 0   Last fall risk screening Fall Risk  02/06/2022  Falls in the past year? 0  Number falls in past yr: 0  Injury with Fall? 0  Risk for fall due to : -  Follow up -   Last Audit-C alcohol use screening Alcohol Use Disorder Test (AUDIT) 02/06/2022  1. How often do you have a drink containing alcohol? 1  2. How many drinks containing alcohol do you have on a typical day when you are drinking? 0  3. How often do you have six or more drinks on one occasion? 0  AUDIT-C Score 1   A score of 3 or more in women, and 4 or more in men indicates increased risk for alcohol abuse, EXCEPT if all of the points are from question 1   No results found for any visits on 02/06/22.  Assessment & Plan    Routine Health Maintenance and Physical Exam  Exercise Activities and Dietary recommendations  Goals   None      There is no immunization history on file for this patient.  Health Maintenance  Topic Date Due   COVID-19 Vaccine (1) Never  done   Hepatitis C Screening  Never done   INFLUENZA VACCINE  03/21/2022 (Originally 07/22/2021)   TETANUS/TDAP  02/06/2023 (Originally 06/04/2003)   PAP SMEAR-Modifier  08/04/2023   HIV Screening  Completed   HPV VACCINES  Aged Out    Discussed health benefits of physical activity, and encouraged her to engage in regular exercise appropriate for her age and condition.    Return in about 1 year (around 02/06/2023) for annual examination.  Vivien Rota DeSanto,acting as a scribe for Jacky Kindle, FNP.,have documented all relevant documentation on the behalf of Jacky Kindle, FNP,as directed by  Jacky Kindle, FNP while in the presence of Jacky Kindle, FNP.  Leilani Merl, FNP, have reviewed all documentation for this visit. The documentation on 02/06/22 for the exam, diagnosis, procedures, and orders are all accurate and complete.    Jacky Kindle, FNP  [facility] [phone] (phone) [phone] (fax)  [facility]

## 2022-02-06 ENCOUNTER — Encounter: Payer: Self-pay | Admitting: Family Medicine

## 2022-02-06 ENCOUNTER — Other Ambulatory Visit: Payer: Self-pay

## 2022-02-06 ENCOUNTER — Ambulatory Visit (INDEPENDENT_AMBULATORY_CARE_PROVIDER_SITE_OTHER): Payer: BC Managed Care – PPO | Admitting: Family Medicine

## 2022-02-06 VITALS — BP 139/76 | HR 80 | Temp 98.8°F | Wt 147.0 lb

## 2022-02-06 DIAGNOSIS — N921 Excessive and frequent menstruation with irregular cycle: Secondary | ICD-10-CM | POA: Diagnosis not present

## 2022-02-06 DIAGNOSIS — E7800 Pure hypercholesterolemia, unspecified: Secondary | ICD-10-CM | POA: Diagnosis not present

## 2022-02-06 DIAGNOSIS — Z1159 Encounter for screening for other viral diseases: Secondary | ICD-10-CM | POA: Insufficient documentation

## 2022-02-06 DIAGNOSIS — Z114 Encounter for screening for human immunodeficiency virus [HIV]: Secondary | ICD-10-CM | POA: Diagnosis not present

## 2022-02-06 DIAGNOSIS — Z0000 Encounter for general adult medical examination without abnormal findings: Secondary | ICD-10-CM | POA: Diagnosis not present

## 2022-02-06 DIAGNOSIS — Z8669 Personal history of other diseases of the nervous system and sense organs: Secondary | ICD-10-CM | POA: Insufficient documentation

## 2022-02-06 DIAGNOSIS — G43709 Chronic migraine without aura, not intractable, without status migrainosus: Secondary | ICD-10-CM | POA: Diagnosis not present

## 2022-02-06 DIAGNOSIS — I10 Essential (primary) hypertension: Secondary | ICD-10-CM | POA: Diagnosis not present

## 2022-02-06 NOTE — Assessment & Plan Note (Signed)
Reports hx of migraines prior to birth of her daughter 10 years ago Hx of intermittent migraines 1-3 days in length Headache log provided to recommend finding trends/patterns

## 2022-02-06 NOTE — Assessment & Plan Note (Signed)
Controlled, check metabolic panel, f/u in 1 year, continue medication Chronic, stable Denies CP Denies SOB Denies DOE No LE Edema noted on exam Continue medication Refills provided Seek emergent care if you develop CP, chest pain or chest pressure

## 2022-02-06 NOTE — Assessment & Plan Note (Signed)
UTD on dental °Due for vision °Things to do to keep yourself healthy  °- Exercise at least 30-45 minutes a day, 3-4 days a week.  °- Eat a low-fat diet with lots of fruits and vegetables, up to 7-9 servings per day.  °- Seatbelts can save your life. Wear them always.  °- Smoke detectors on every level of your home, check batteries every year.  °- Eye Doctor - have an eye exam every 1-2 years  °- Safe sex - if you may be exposed to STDs, use a condom.  °- Alcohol -  If you drink, do it moderately, less than 2 drinks per day.  °- Health Care Power of Attorney. Choose someone to speak for you if you are not able.  °- Depression is common in our stressful world.If you're feeling down or losing interest in things you normally enjoy, please come in for a visit.  °- Violence - If anyone is threatening or hurting you, please call immediately. ° ° °

## 2022-02-07 ENCOUNTER — Other Ambulatory Visit: Payer: Self-pay | Admitting: Obstetrics and Gynecology

## 2022-02-07 LAB — COMPREHENSIVE METABOLIC PANEL
ALT: 21 IU/L (ref 0–32)
AST: 20 IU/L (ref 0–40)
Albumin/Globulin Ratio: 2.3 — ABNORMAL HIGH (ref 1.2–2.2)
Albumin: 5.2 g/dL — ABNORMAL HIGH (ref 3.8–4.8)
Alkaline Phosphatase: 61 IU/L (ref 44–121)
BUN/Creatinine Ratio: 19 (ref 9–23)
BUN: 13 mg/dL (ref 6–20)
Bilirubin Total: 0.4 mg/dL (ref 0.0–1.2)
CO2: 24 mmol/L (ref 20–29)
Calcium: 9.8 mg/dL (ref 8.7–10.2)
Chloride: 100 mmol/L (ref 96–106)
Creatinine, Ser: 0.69 mg/dL (ref 0.57–1.00)
Globulin, Total: 2.3 g/dL (ref 1.5–4.5)
Glucose: 88 mg/dL (ref 70–99)
Potassium: 4.1 mmol/L (ref 3.5–5.2)
Sodium: 139 mmol/L (ref 134–144)
Total Protein: 7.5 g/dL (ref 6.0–8.5)
eGFR: 115 mL/min/{1.73_m2} (ref >59)

## 2022-02-07 LAB — CBC WITH DIFFERENTIAL/PLATELET
Basophils Absolute: 0 10*3/uL (ref 0.0–0.2)
Basos: 1 %
EOS (ABSOLUTE): 0 10*3/uL (ref 0.0–0.4)
Eos: 1 %
Hematocrit: 45.2 % (ref 34.0–46.6)
Hemoglobin: 15.4 g/dL (ref 11.1–15.9)
Immature Grans (Abs): 0 10*3/uL (ref 0.0–0.1)
Immature Granulocytes: 0 %
Lymphocytes Absolute: 1.4 10*3/uL (ref 0.7–3.1)
Lymphs: 24 %
MCH: 27.8 pg (ref 26.6–33.0)
MCHC: 34.1 g/dL (ref 31.5–35.7)
MCV: 82 fL (ref 79–97)
Monocytes Absolute: 0.5 10*3/uL (ref 0.1–0.9)
Monocytes: 8 %
Neutrophils Absolute: 3.9 10*3/uL (ref 1.4–7.0)
Neutrophils: 66 %
Platelets: 351 10*3/uL (ref 150–450)
RBC: 5.53 x10E6/uL — ABNORMAL HIGH (ref 3.77–5.28)
RDW: 12.4 % (ref 11.7–15.4)
WBC: 6 10*3/uL (ref 3.4–10.8)

## 2022-02-07 LAB — LIPID PANEL WITH LDL/HDL RATIO
Cholesterol, Total: 250 mg/dL — ABNORMAL HIGH (ref 100–199)
HDL: 58 mg/dL (ref >39)
LDL Chol Calc (NIH): 166 mg/dL — ABNORMAL HIGH (ref 0–99)
LDL/HDL Ratio: 2.9 ratio (ref 0.0–3.2)
Triglycerides: 147 mg/dL (ref 0–149)
VLDL Cholesterol Cal: 26 mg/dL (ref 5–40)

## 2022-02-07 LAB — TSH+FREE T4
Free T4: 1.13 ng/dL (ref 0.82–1.77)
TSH: 1.41 u[IU]/mL (ref 0.450–4.500)

## 2022-02-07 LAB — HEPATITIS C ANTIBODY: Hep C Virus Ab: NONREACTIVE

## 2022-02-07 LAB — HIV ANTIBODY (ROUTINE TESTING W REFLEX): HIV Screen 4th Generation wRfx: NONREACTIVE

## 2022-02-10 ENCOUNTER — Other Ambulatory Visit: Payer: Self-pay

## 2022-02-10 DIAGNOSIS — I10 Essential (primary) hypertension: Secondary | ICD-10-CM

## 2022-02-10 MED ORDER — AMLODIPINE BESYLATE 5 MG PO TABS: ORAL_TABLET | ORAL | 1 refills | Status: DC

## 2022-02-10 NOTE — Telephone Encounter (Signed)
Pt is requesting a refill on this medication.  Please advise

## 2022-04-03 ENCOUNTER — Telehealth: Payer: Self-pay | Admitting: Obstetrics and Gynecology

## 2022-04-03 NOTE — Telephone Encounter (Signed)
Pt called stating that she had lower abdominal pain that lasted about 45 min to an hour. Almost went to ER due to level of pain. Pt is asking if she need to be seen. Please advise.  ?

## 2022-04-08 ENCOUNTER — Encounter: Payer: Self-pay | Admitting: Family Medicine

## 2022-04-08 ENCOUNTER — Ambulatory Visit (INDEPENDENT_AMBULATORY_CARE_PROVIDER_SITE_OTHER): Payer: BC Managed Care – PPO | Admitting: Family Medicine

## 2022-04-08 VITALS — BP 135/92 | HR 97 | Temp 98.4°F | Wt 151.0 lb

## 2022-04-08 DIAGNOSIS — I10 Essential (primary) hypertension: Secondary | ICD-10-CM

## 2022-04-08 DIAGNOSIS — N3090 Cystitis, unspecified without hematuria: Secondary | ICD-10-CM | POA: Insufficient documentation

## 2022-04-08 DIAGNOSIS — R319 Hematuria, unspecified: Secondary | ICD-10-CM | POA: Diagnosis not present

## 2022-04-08 LAB — POCT URINALYSIS DIPSTICK
Bilirubin, UA: NEGATIVE
Glucose, UA: NEGATIVE
Ketones, UA: NEGATIVE
Nitrite, UA: NEGATIVE
Protein, UA: NEGATIVE
Spec Grav, UA: 1.01 (ref 1.010–1.025)
Urobilinogen, UA: 0.2 E.U./dL
pH, UA: 7.5 (ref 5.0–8.0)

## 2022-04-08 MED ORDER — LOSARTAN POTASSIUM-HCTZ 50-12.5 MG PO TABS: 1.0000 | ORAL_TABLET | Freq: Every day | ORAL | 0 refills | Status: DC

## 2022-04-08 MED ORDER — NITROFURANTOIN MONOHYD MACRO 100 MG PO CAPS: 100.0000 mg | ORAL_CAPSULE | Freq: Two times a day (BID) | ORAL | 0 refills | Status: DC

## 2022-04-08 NOTE — Progress Notes (Signed)
?  ?I,Elena D DeSanto,acting as a scribe for Gwyneth Sprout, FNP.,have documented all relevant documentation on the behalf of Gwyneth Sprout, FNP,as directed by  Gwyneth Sprout, FNP while in the presence of Gwyneth Sprout, FNP. ? ?Established patient visit ? ? ?Patient: Brooke Koch   DOB: 02-01-1986   36 y.o. Female  MRN: [id number] ?Visit Date: 04/08/2022 ? ?Today's healthcare provider: Gwyneth Sprout, FNP  ?Re Introduced to nurse practitioner role and practice setting.  All questions answered.  Discussed provider/patient relationship and expectations. ? ? ?UTI + leukocytes on home UTI kit; was not able to be seen at GYN. ? ?Subjective  ?  ?HPI  ?Patient is a 36 year female who presents for evaluation of possible urinary symptoms. Patient states for about 10 days she has been having suprapubic pain.  She denies any fever, dysuria, odor, or vaginal discharge.   ?She states she did an OTC UTI test and it was positive for leukocytes. ? ?Medications: ?Outpatient Medications Prior to Visit  ?Medication Sig  ? Dapsone (ACZONE) 7.5 % GEL Apply to face every morning for acne.  ? Multiple Vitamin (MULTIVITAMIN) tablet Take 1 tablet by mouth daily.  ? Omega-3 Fatty Acids (FISH OIL PO) Take by mouth daily.  ? SLYND 4 MG TABS TAKE ONE (1) TABLET BY MOUTH DAILY.  ? [DISCONTINUED] amLODipine (NORVASC) 5 MG tablet TAKE 1 TABLET(5 MG) BY MOUTH DAILY  ? ?No facility-administered medications prior to visit.  ? ? ?Review of Systems ? ? ?  Objective  ?  ?BP (!) 135/92 (BP Location: Right Arm, Patient Position: Sitting, Cuff Size: Normal)   Pulse 97   Temp 98.4 ?F (36.9 ?C) (Oral)   Wt 151 lb (68.5 kg)   SpO2 98%   BMI 26.54 kg/m?  ? ? ?Physical Exam ?Vitals and nursing note reviewed.  ?Constitutional:   ?   General: She is awake. She is not in acute distress. ?   Appearance: Normal appearance. She is well-developed, well-groomed and overweight. She is not ill-appearing, toxic-appearing or diaphoretic.  ?HENT:  ?   Head:  Normocephalic and atraumatic.  ?   Jaw: There is normal jaw occlusion. No trismus, tenderness, swelling or pain on movement.  ?   Right Ear: Hearing normal.  ?   Left Ear: Hearing normal.  ?   Nose:  ?   Right Turbinates: Not enlarged, swollen or pale.  ?   Left Turbinates: Not enlarged, swollen or pale.  ?   Right Sinus: No maxillary sinus tenderness or frontal sinus tenderness.  ?   Left Sinus: No maxillary sinus tenderness or frontal sinus tenderness.  ?   Mouth/Throat:  ?   Lips: Pink.  ?   Mouth: No injury.  ?   Tongue: No lesions.  ?   Pharynx: Uvula midline. No pharyngeal swelling or uvula swelling.  ?   Tonsils: No tonsillar exudate or tonsillar abscesses.  ?Eyes:  ?   General: Lids are normal. Lids are everted, no foreign bodies appreciated. Vision grossly intact. Gaze aligned appropriately. No allergic shiner or visual field deficit. ?   Conjunctiva/sclera:  ?   Right eye: Right conjunctiva is not injected. No exudate. ?   Left eye: Left conjunctiva is not injected. No exudate. ?Neck:  ?   Thyroid: No thyroid mass, thyromegaly or thyroid tenderness.  ?   Trachea: Trachea normal.  ?Cardiovascular:  ?   Rate and Rhythm: Normal rate and regular rhythm.  ?  Pulses: Normal pulses.     ?     Carotid pulses are 2+ on the right side and 2+ on the left side. ?     Radial pulses are 2+ on the right side and 2+ on the left side.  ?     Dorsalis pedis pulses are 2+ on the right side and 2+ on the left side.  ?     Posterior tibial pulses are 2+ on the right side and 2+ on the left side.  ?   Heart sounds: Normal heart sounds, S1 normal and S2 normal. No murmur heard. ?  No friction rub. No gallop.  ?Pulmonary:  ?   Effort: Pulmonary effort is normal. No respiratory distress.  ?   Breath sounds: Normal breath sounds and air entry. No stridor. No wheezing, rhonchi or rales.  ?Chest:  ?   Chest wall: No tenderness.  ?Abdominal:  ?   General: Abdomen is flat. Bowel sounds are normal. There is no distension.  ?    Palpations: Abdomen is soft. There is no mass.  ?   Tenderness: There is abdominal tenderness. There is no right CVA tenderness, left CVA tenderness, guarding or rebound.  ?   Hernia: No hernia is present.  ?   Comments: Bladder pressure, occasionally > 1 week hx  ?Genitourinary: ?   Comments: Exam deferred; denies complaints ?Musculoskeletal:  ?   Cervical back: Full passive range of motion without pain. No edema. No muscular tenderness.  ?Lymphadenopathy:  ?   Cervical:  ?   Right cervical: No superficial, deep or posterior cervical adenopathy. ?   Left cervical: No superficial, deep or posterior cervical adenopathy.  ?Skin: ?   General: Skin is warm and dry.  ?   Capillary Refill: Capillary refill takes less than 2 seconds.  ?Neurological:  ?   General: No focal deficit present.  ?   Mental Status: She is alert and oriented to person, place, and time. Mental status is at baseline.  ?   GCS: GCS eye subscore is 4. GCS verbal subscore is 5. GCS motor subscore is 6.  ?   Sensory: Sensation is intact.  ?   Motor: No weakness.  ?   Coordination: Coordination is intact.  ?   Gait: Gait is intact.  ?Psychiatric:     ?   Attention and Perception: Attention and perception normal.     ?   Mood and Affect: Mood and affect normal.     ?   Speech: Speech normal.     ?   Behavior: Behavior normal. Behavior is cooperative.     ?   Thought Content: Thought content normal.     ?   Cognition and Memory: Cognition and memory normal.     ?   Judgment: Judgment normal.  ?  ? ?Results for orders placed or performed in visit on 04/08/22  ?POCT urinalysis dipstick  ?Result Value Ref Range  ? Color, UA yellow   ? Clarity, UA hazy   ? Glucose, UA Negative Negative  ? Bilirubin, UA neg   ? Ketones, UA neg   ? Spec Grav, UA 1.010 1.010 - 1.025  ? Blood, UA 1+   ? pH, UA 7.5 5.0 - 8.0  ? Protein, UA Negative Negative  ? Urobilinogen, UA 0.2 0.2 or 1.0 E.U./dL  ? Nitrite, UA neg   ? Leukocytes, UA Small (1+) (A) Negative  ? Appearance    ?  Odor    ? ?  Assessment & Plan  ?  ? ?Problem List Items Addressed This Visit   ? ?  ? Cardiovascular and Mediastinum  ? Primary hypertension  ?  Chronic, elevated- has stopped use of Norvasc x 2 days d/t side effects ?Denies CP ?Denies SOB/ DOE ?Denies low blood pressure/hypotension ?Denies vision changes ?No LE Edema noted on exam ?Start Hyzaar 50-12.5 ?RTC 4-6 wks ?Seek emergent care if you develop chest pain or chest pressure ? ?  ?  ? Relevant Medications  ? losartan-hydrochlorothiazide (HYZAAR) 50-12.5 MG tablet  ?  ? Genitourinary  ? Cystitis - Primary  ?  Acute > 1 week ?With bladder pressure ?-CVA tenderness ?Will send for micro ?Will send for cx ?Will treat today; advised urine cx may change abx request- pt will be notified on online thru mychart ? ? ?  ?  ? Relevant Medications  ? nitrofurantoin, macrocrystal-monohydrate, (MACROBID) 100 MG capsule  ?  ? Other  ? RESOLVED: Hematuria  ? Relevant Orders  ? POCT urinalysis dipstick (Completed)  ? Urine Culture  ? Urinalysis, microscopic only  ? ? ? ?Return in about 6 weeks (around 05/20/2022).  ?   ? ?I, Gwyneth Sprout, FNP, have reviewed all documentation for this visit. The documentation on 04/08/22 for the exam, diagnosis, procedures, and orders are all accurate and complete. ? ? ? ?Gwyneth Sprout, FNP  ?[facility] ?[phone] (phone) ?[phone] (fax) ? ?[facility] ?

## 2022-04-08 NOTE — Assessment & Plan Note (Signed)
Chronic, elevated- has stopped use of Norvasc x 2 days d/t side effects ?Denies CP ?Denies SOB/ DOE ?Denies low blood pressure/hypotension ?Denies vision changes ?No LE Edema noted on exam ?Start Hyzaar 50-12.5 ?RTC 4-6 wks ?Seek emergent care if you develop chest pain or chest pressure ? ?

## 2022-04-08 NOTE — Assessment & Plan Note (Signed)
Acute > 1 week ?With bladder pressure ?-CVA tenderness ?Will send for micro ?Will send for cx ?Will treat today; advised urine cx may change abx request- pt will be notified on online thru mychart ? ?

## 2022-04-08 NOTE — Telephone Encounter (Signed)
I tried to reach out to patient, no answer, sent a mychart message. ?

## 2022-04-09 LAB — URINALYSIS, MICROSCOPIC ONLY
Casts: NONE SEEN /lpf
WBC, UA: NONE SEEN /hpf (ref 0–5)

## 2022-04-10 LAB — URINE CULTURE

## 2022-05-20 NOTE — Progress Notes (Unsigned)
Established patient visit   Patient: Brooke Koch   DOB: 1986/03/14   36 y.o. Female  MRN: [id number] Visit Date: 05/21/2022  Today's healthcare provider: Gwyneth Sprout, FNP  Re Introduced to nurse practitioner role and practice setting.  All questions answered.  Discussed provider/patient relationship and expectations.  I,Flois Mctague J Jakerria Kingbird,acting as a scribe for Gwyneth Sprout, FNP.,have documented all relevant documentation on the behalf of Gwyneth Sprout, FNP,as directed by  Gwyneth Sprout, FNP while in the presence of Gwyneth Sprout, FNP.   Chief Complaint  Patient presents with   Hypertension   Subjective    HPI  Hypertension, follow-up  BP Readings from Last 3 Encounters:  05/21/22 117/71  04/08/22 (!) 135/92  02/06/22 139/76   Wt Readings from Last 3 Encounters:  05/21/22 155 lb 11.2 oz (70.6 kg)  04/08/22 151 lb (68.5 kg)  02/06/22 147 lb (66.7 kg)     She was last seen for hypertension 1 month ago.  BP at that visit was 135/92. Management since that visit includes start hyzaar 50-12.5.  She reports excellent compliance with treatment. She is not having side effects.  She is following a  poor  diet. She is not exercising. She does not smoke.  Use of agents associated with hypertension: none.   Outside blood pressures are around 130/87. Symptoms: No chest pain No chest pressure  No palpitations No syncope  No dyspnea No orthopnea  No paroxysmal nocturnal dyspnea No lower extremity edema   Pertinent labs Lab Results  Component Value Date   CHOL 250 (H) 02/06/2022   HDL 58 02/06/2022   LDLCALC 166 (H) 02/06/2022   TRIG 147 02/06/2022   CHOLHDL 4.1 08/14/2021   Lab Results  Component Value Date   NA 139 02/06/2022   K 4.1 02/06/2022   CREATININE 0.69 02/06/2022   EGFR 115 02/06/2022   GLUCOSE 88 02/06/2022   TSH 1.410 02/06/2022     The ASCVD Risk score (Arnett DK, et al., 2019) failed to calculate for the following reasons:   The 2019 ASCVD  risk score is only valid for ages 61 to 16  ---------------------------------------------------------------------------------------------------   Medications: Outpatient Medications Prior to Visit  Medication Sig   Dapsone (ACZONE) 7.5 % GEL Apply to face every morning for acne.   Multiple Vitamin (MULTIVITAMIN) tablet Take 1 tablet by mouth daily.   Omega-3 Fatty Acids (FISH OIL PO) Take by mouth daily.   SLYND 4 MG TABS TAKE ONE (1) TABLET BY MOUTH DAILY.   [DISCONTINUED] losartan-hydrochlorothiazide (HYZAAR) 50-12.5 MG tablet Take 1 tablet by mouth daily.   [DISCONTINUED] nitrofurantoin, macrocrystal-monohydrate, (MACROBID) 100 MG capsule Take 1 capsule (100 mg total) by mouth 2 (two) times daily.   No facility-administered medications prior to visit.    Review of Systems     Objective    BP 117/71 (BP Location: Right Arm, Patient Position: Sitting, Cuff Size: Normal)   Pulse 77   Temp 98.6 F (37 C) (Oral)   Resp 16   Ht 5' 4"  (1.626 m)   Wt 155 lb 11.2 oz (70.6 kg)   SpO2 98%   BMI 26.73 kg/m    Physical Exam Vitals and nursing note reviewed.  Constitutional:      General: She is not in acute distress.    Appearance: Normal appearance. She is overweight. She is not ill-appearing, toxic-appearing or diaphoretic.  HENT:     Head: Normocephalic and atraumatic.  Cardiovascular:  Rate and Rhythm: Normal rate and regular rhythm.     Pulses: Normal pulses.     Heart sounds: Normal heart sounds. No murmur heard.   No friction rub. No gallop.  Pulmonary:     Effort: Pulmonary effort is normal. No respiratory distress.     Breath sounds: Normal breath sounds. No stridor. No wheezing, rhonchi or rales.  Chest:     Chest wall: No tenderness.  Abdominal:     General: Bowel sounds are normal.     Palpations: Abdomen is soft.  Musculoskeletal:        General: No swelling, tenderness, deformity or signs of injury. Normal range of motion.     Right lower leg: No edema.      Left lower leg: No edema.  Skin:    General: Skin is warm and dry.     Capillary Refill: Capillary refill takes less than 2 seconds.     Coloration: Skin is not jaundiced or pale.     Findings: No bruising, erythema, lesion or rash.  Neurological:     General: No focal deficit present.     Mental Status: She is alert and oriented to person, place, and time. Mental status is at baseline.     Cranial Nerves: No cranial nerve deficit.     Sensory: No sensory deficit.     Motor: No weakness.     Coordination: Coordination normal.  Psychiatric:        Mood and Affect: Mood normal.        Behavior: Behavior normal.        Thought Content: Thought content normal.        Judgment: Judgment normal.     No results found for any visits on 05/21/22.  Assessment & Plan     Problem List Items Addressed This Visit       Cardiovascular and Mediastinum   Primary hypertension    Chronic, stable; goal, <140/<90 Denies CP Denies SOB/ DOE Denies low blood pressure/hypotension Denies vision changes No LE Edema noted on exam Continue medication, Losartan-HCTZ, 50-12.5 mg Denies side effects Encouraged DASH diet Encouraged purposeful exercise Seek emergent care if you develop chest pain or chest pressure       Relevant Medications   losartan-hydrochlorothiazide (HYZAAR) 50-12.5 MG tablet     Return in about 9 months (around 02/19/2023) for annual examination.      Vonna Kotyk, FNP, have reviewed all documentation for this visit. The documentation on 05/21/22 for the exam, diagnosis, procedures, and orders are all accurate and complete.    Gwyneth Sprout, [place] [phone] (phone) [phone] (fax)  [place]

## 2022-05-21 ENCOUNTER — Encounter: Payer: Self-pay | Admitting: Family Medicine

## 2022-05-21 ENCOUNTER — Ambulatory Visit (INDEPENDENT_AMBULATORY_CARE_PROVIDER_SITE_OTHER): Payer: BC Managed Care – PPO | Admitting: Family Medicine

## 2022-05-21 DIAGNOSIS — I10 Essential (primary) hypertension: Secondary | ICD-10-CM

## 2022-05-21 MED ORDER — LOSARTAN POTASSIUM-HCTZ 50-12.5 MG PO TABS: 1.0000 | ORAL_TABLET | Freq: Every day | ORAL | 3 refills | Status: DC

## 2022-05-21 NOTE — Assessment & Plan Note (Signed)
Chronic, stable; goal, <140/<90 Denies CP Denies SOB/ DOE Denies low blood pressure/hypotension Denies vision changes No LE Edema noted on exam Continue medication, Losartan-HCTZ, 50-12.5 mg Denies side effects Encouraged DASH diet Encouraged purposeful exercise Seek emergent care if you develop chest pain or chest pressure

## 2022-07-17 ENCOUNTER — Ambulatory Visit: Payer: BC Managed Care – PPO | Admitting: Physician Assistant

## 2022-08-19 NOTE — Progress Notes (Unsigned)
GYNECOLOGY ANNUAL PHYSICAL EXAM PROGRESS NOTE  Subjective:    Brooke Koch is a 36 y.o. G72P1001 female who presents for an annual exam. The patient has no complaints today. The patient is sexually active. The patient participates in regular exercise: yes. Has the patient ever been transfused or tattooed?: Professional tattoos, no transfusion. The patient reports that there is not domestic violence in her life.    Menstrual History: Menarche age: 58 No LMP recorded.     Gynecologic History:  Contraception: oral progesterone-only contraceptive History of STI's: Denies Last Pap: 08/03/2020 Results were: normal.  Denies h/o abnormal pap smears.   OB History  Gravida Para Term Preterm AB Living  1 1 1  0 0 1  SAB IAB Ectopic Multiple Live Births  0 0 0 0 1    # Outcome Date GA Lbr Len/2nd Weight Sex Delivery Anes PTL Lv  1 Term 06/09/11 [redacted]w[redacted]d  5 lb 10 oz (2.551 kg) F Vag-Spont   LIV     Birth Comments: Cleft Hand     Past Medical History:  Diagnosis Date   Gastritis    History of Helicobacter pylori infection    Wears contact lenses     Past Surgical History:  Procedure Laterality Date   COLONOSCOPY WITH PROPOFOL N/A 10/29/2020   Procedure: COLONOSCOPY WITH BIOPSY;  Surgeon: 13/07/2020, MD;  Location: [facility];  Service: Endoscopy;  Laterality: N/A;   ESOPHAGOGASTRODUODENOSCOPY  12/2009    Family History  Problem Relation Age of Onset   Hypertension Mother    Glaucoma Mother    Hyperlipidemia Mother    Hypertension Father    Diabetes Father    Prostate cancer Father        cancer free x 1 year   Breast cancer Cousin    Breast cancer Cousin     Social History   Socioeconomic History   Marital status: Single    Spouse name: Not on file   Number of children: Not on file   Years of education: Not on file   Highest education level: Not on file  Occupational History   Not on file  Tobacco Use   Smoking status: Never   Smokeless tobacco:  Never  Vaping Use   Vaping Use: Never used  Substance and Sexual Activity   Alcohol use: Yes    Alcohol/week: 0.0 standard drinks of alcohol    Comment: social drinker   Drug use: No   Sexual activity: Yes  Other Topics Concern   Not on file  Social History Narrative   Not on file   Social Determinants of Health   Financial Resource Strain: Not on file  Food Insecurity: Not on file  Transportation Needs: Not on file  Physical Activity: Not on file  Stress: Not on file  Social Connections: Not on file  Intimate Partner Violence: Not on file    Current Outpatient Medications on File Prior to Visit  Medication Sig Dispense Refill   Dapsone (ACZONE) 7.5 % GEL Apply to face every morning for acne. 90 g 3   losartan-hydrochlorothiazide (HYZAAR) 50-12.5 MG tablet Take 1 tablet by mouth daily. 90 tablet 3   Multiple Vitamin (MULTIVITAMIN) tablet Take 1 tablet by mouth daily.     Omega-3 Fatty Acids (FISH OIL PO) Take by mouth daily.     SLYND 4 MG TABS TAKE ONE (1) TABLET BY MOUTH DAILY. 84 tablet 3   No current facility-administered medications on file prior to visit.  Allergies  Allergen Reactions   Septra [Sulfamethoxazole-Trimethoprim] Hives     Review of Systems Constitutional: negative for chills, fatigue, fevers and sweats Eyes: negative for irritation, redness and visual disturbance Ears, nose, mouth, throat, and face: negative for hearing loss, nasal congestion, snoring and tinnitus Respiratory: negative for asthma, cough, sputum Cardiovascular: negative for chest pain, dyspnea, exertional chest pressure/discomfort, irregular heart beat, palpitations and syncope Gastrointestinal: negative for abdominal pain, change in bowel habits, nausea and vomiting Genitourinary: negative for abnormal menstrual periods, genital lesions, sexual problems and vaginal discharge, dysuria and urinary incontinence Integument/breast: negative for breast lump, breast tenderness and  nipple discharge Hematologic/lymphatic: negative for bleeding and easy bruising Musculoskeletal:negative for back pain and muscle weakness Neurological: negative for dizziness, headaches, vertigo and weakness Endocrine: negative for diabetic symptoms including polydipsia, polyuria and skin dryness Allergic/Immunologic: negative for hay fever and urticaria      Objective:  There were no vitals taken for this visit. There is no height or weight on file to calculate BMI.    General Appearance:    Alert, cooperative, no distress, appears stated age  Head:    Normocephalic, without obvious abnormality, atraumatic  Eyes:    PERRL, conjunctiva/corneas clear, EOM's intact, both eyes  Ears:    Normal external ear canals, both ears  Nose:   Nares normal, septum midline, mucosa normal, no drainage or sinus tenderness  Throat:   Lips, mucosa, and tongue normal; teeth and gums normal  Neck:   Supple, symmetrical, trachea midline, no adenopathy; thyroid: no enlargement/tenderness/nodules; no carotid bruit or JVD  Back:     Symmetric, no curvature, ROM normal, no CVA tenderness  Lungs:     Clear to auscultation bilaterally, respirations unlabored  Chest Wall:    No tenderness or deformity   Heart:    Regular rate and rhythm, S1 and S2 normal, no murmur, rub or gallop  Breast Exam:    No tenderness, masses, or nipple abnormality  Abdomen:     Soft, non-tender, bowel sounds active all four quadrants, no masses, no organomegaly.    Genitalia:    Pelvic:external genitalia normal, vagina without lesions, discharge, or tenderness, rectovaginal septum  normal. Cervix normal in appearance, no cervical motion tenderness, no adnexal masses or tenderness.  Uterus normal size, shape, mobile, regular contours, nontender.  Rectal:    Normal external sphincter.  No hemorrhoids appreciated. Internal exam not done.   Extremities:   Extremities normal, atraumatic, no cyanosis or edema  Pulses:   2+ and symmetric all  extremities  Skin:   Skin color, texture, turgor normal, no rashes or lesions  Lymph nodes:   Cervical, supraclavicular, and axillary nodes normal  Neurologic:   CNII-XII intact, normal strength, sensation and reflexes throughout   .  Labs:  Lab Results  Component Value Date   WBC 6.0 02/06/2022   HGB 15.4 02/06/2022   HCT 45.2 02/06/2022   MCV 82 02/06/2022   PLT 351 02/06/2022    Lab Results  Component Value Date   CREATININE 0.69 02/06/2022   BUN 13 02/06/2022   NA 139 02/06/2022   K 4.1 02/06/2022   CL 100 02/06/2022   CO2 24 02/06/2022    Lab Results  Component Value Date   ALT 21 02/06/2022   AST 20 02/06/2022   ALKPHOS 61 02/06/2022   BILITOT 0.4 02/06/2022    Lab Results  Component Value Date   TSH 1.410 02/06/2022     Assessment:   No diagnosis found.  Plan:  Blood tests: {blood tests:13147}. Breast self exam technique reviewed and patient encouraged to perform self-exam monthly. Contraception: oral progesterone-only contraceptive. Discussed healthy lifestyle modifications. Mammogram  : Not age appropriate Pap smear  UTD . COVID vaccination status: Follow up in 1 year for annual exam   Hildred Laser, MD [facility]

## 2022-08-20 ENCOUNTER — Encounter: Payer: Self-pay | Admitting: Obstetrics and Gynecology

## 2022-08-20 ENCOUNTER — Ambulatory Visit (INDEPENDENT_AMBULATORY_CARE_PROVIDER_SITE_OTHER): Payer: BC Managed Care – PPO | Admitting: Obstetrics and Gynecology

## 2022-08-20 VITALS — BP 135/82 | HR 82 | Ht 64.0 in | Wt 152.0 lb

## 2022-08-20 DIAGNOSIS — E785 Hyperlipidemia, unspecified: Secondary | ICD-10-CM

## 2022-08-20 DIAGNOSIS — N760 Acute vaginitis: Secondary | ICD-10-CM

## 2022-08-20 DIAGNOSIS — Z3041 Encounter for surveillance of contraceptive pills: Secondary | ICD-10-CM | POA: Diagnosis not present

## 2022-08-20 DIAGNOSIS — I10 Essential (primary) hypertension: Secondary | ICD-10-CM

## 2022-08-20 DIAGNOSIS — Z01419 Encounter for gynecological examination (general) (routine) without abnormal findings: Secondary | ICD-10-CM

## 2022-08-20 DIAGNOSIS — Z01411 Encounter for gynecological examination (general) (routine) with abnormal findings: Secondary | ICD-10-CM

## 2022-08-20 DIAGNOSIS — B9689 Other specified bacterial agents as the cause of diseases classified elsewhere: Secondary | ICD-10-CM

## 2022-08-21 ENCOUNTER — Encounter: Payer: Self-pay | Admitting: Obstetrics and Gynecology

## 2022-08-21 ENCOUNTER — Telehealth: Payer: Self-pay | Admitting: Obstetrics and Gynecology

## 2022-08-21 ENCOUNTER — Other Ambulatory Visit: Payer: Self-pay | Admitting: Obstetrics and Gynecology

## 2022-08-21 DIAGNOSIS — B9689 Other specified bacterial agents as the cause of diseases classified elsewhere: Secondary | ICD-10-CM

## 2022-08-21 MED ORDER — SOLOSEC 2 G PO PACK: PACK | ORAL | 0 refills | Status: DC

## 2022-08-21 MED ORDER — METRONIDAZOLE 500 MG PO TABS: 500.0000 mg | ORAL_TABLET | Freq: Two times a day (BID) | ORAL | 0 refills | Status: DC

## 2022-08-21 NOTE — Telephone Encounter (Signed)
Called patient to inform her that vaginal swab that was obtained was positive for BV. Solosec sent to pharmacy. May need a prior authorization. Patient verbalized understanding.

## 2022-08-21 NOTE — Addendum Note (Signed)
Addended by: Tommie Raymond on: 08/21/2022 10:27 AM   Modules accepted: Orders

## 2022-08-21 NOTE — Telephone Encounter (Signed)
Patient is calling to follow up on results from yesterday's swab. Patient is wanting to know if a prescription has been sent in. Please advise?[facility] [place]

## 2022-08-29 NOTE — Telephone Encounter (Signed)
Was this issue resolved?

## 2022-09-09 ENCOUNTER — Ambulatory Visit: Payer: BC Managed Care – PPO | Admitting: Dermatology

## 2022-10-10 ENCOUNTER — Other Ambulatory Visit: Payer: Self-pay | Admitting: Obstetrics and Gynecology

## 2022-10-14 NOTE — Telephone Encounter (Signed)
Refilled OCP's to get to pt annual next august

## 2023-01-01 NOTE — Progress Notes (Signed)
    GYNECOLOGY PROGRESS NOTE  Subjective:    Patient ID: Brooke Koch, female    DOB: 06-18-1986, 37 y.o.   MRN: [id number]  HPI  Patient is a 37 y.o. G63P1001 female who presents for self swab for possible bacteria vaginosis. She reports vaginal odor x 2 months Denies itching, irritation or discharge. She has tried treating herself with probiotics and boric acid suppositories with no relief. This is recurrent BV. Ph-D samples given to patient to try. Self swab was obtained and sent to lab.  The following portions of the patient's history were reviewed and updated as appropriate: allergies, current medications, past family history, past medical history, past social history, past surgical history, and problem list.  Review of Systems Pertinent items noted in HPI and remainder of comprehensive ROS otherwise negative.   Objective:   Blood pressure 128/87, pulse 75, resp. rate 16, height 5\' 4"  (1.626 m), weight 157 lb 6.4 oz (71.4 kg). Body mass index is 27.02 kg/m.  General appearance: alert, cooperative, and no distress  Assessment:   1. Bacterial vaginosis      Plan:   1. Bacterial vaginosis  - Follow up if symptoms worsen of fell to improve. - Check mychart for results and follow up.    Cristy Folks, [facility]

## 2023-01-01 NOTE — Patient Instructions (Incomplete)

## 2023-01-05 ENCOUNTER — Ambulatory Visit (INDEPENDENT_AMBULATORY_CARE_PROVIDER_SITE_OTHER): Payer: BC Managed Care – PPO

## 2023-01-05 ENCOUNTER — Other Ambulatory Visit (HOSPITAL_COMMUNITY)
Admission: RE | Admit: 2023-01-05 | Discharge: 2023-01-05 | Disposition: A | Discharge status: Home / Self Care | Payer: BC Managed Care – PPO | Source: Ambulatory Visit | Attending: Obstetrics and Gynecology | Admitting: Obstetrics and Gynecology

## 2023-01-05 VITALS — BP 128/87 | HR 75 | Resp 16 | Ht 64.0 in | Wt 157.4 lb

## 2023-01-05 DIAGNOSIS — B9689 Other specified bacterial agents as the cause of diseases classified elsewhere: Secondary | ICD-10-CM | POA: Insufficient documentation

## 2023-01-05 DIAGNOSIS — N760 Acute vaginitis: Secondary | ICD-10-CM | POA: Insufficient documentation

## 2023-01-06 ENCOUNTER — Encounter: Payer: Self-pay | Admitting: Obstetrics and Gynecology

## 2023-01-06 LAB — CERVICOVAGINAL ANCILLARY ONLY
Bacterial Vaginitis (gardnerella): NEGATIVE
Candida Glabrata: NEGATIVE
Candida Vaginitis: NEGATIVE
Comment: NEGATIVE
Comment: NEGATIVE
Comment: NEGATIVE

## 2023-02-09 NOTE — Progress Notes (Unsigned)
I,Zeno Hickel R Jarelly Rinck,acting as a Education administrator for Brooke Sprout, FNP.,have documented all relevant documentation on the behalf of Brooke Sprout, FNP,as directed by  Brooke Sprout, FNP while in the presence of Brooke Sprout, FNP.  Complete physical exam  Patient: Brooke Koch   DOB: 11-Oct-1986   37 y.o. Female  MRN: [id number] Visit Date: 02/10/2023  Today's healthcare provider: Gwyneth Sprout, FNP  Re Introduced to nurse practitioner role and practice setting.  All questions answered.  Discussed provider/patient relationship and expectations.   Chief Complaint  Patient presents with   Annual Exam   Subjective    Brooke Koch is a 37 y.o. female who presents today for a complete physical exam.  She reports consuming a general diet. The patient does not participate in regular exercise at present. She generally feels well. She reports sleeping well. She does not have additional problems to discuss today.  HPI  Patient declined all due vaccines.   Past Medical History:  Diagnosis Date   Gastritis    History of Helicobacter pylori infection    Wears contact lenses    Past Surgical History:  Procedure Laterality Date   COLONOSCOPY WITH PROPOFOL N/A 10/29/2020   Procedure: COLONOSCOPY WITH BIOPSY;  Surgeon: [name], MD;  Location: [facility];  Service: Endoscopy;  Laterality: N/A;   ESOPHAGOGASTRODUODENOSCOPY  12/2009   Social History   Socioeconomic History   Marital status: Single    Spouse name: Not on file   Number of children: Not on file   Years of education: Not on file   Highest education level: Not on file  Occupational History   Not on file  Tobacco Use   Smoking status: Never   Smokeless tobacco: Never  Vaping Use   Vaping Use: Never used  Substance and Sexual Activity   Alcohol use: Yes    Alcohol/week: 0.0 standard drinks of alcohol    Comment: social drinker   Drug use: No   Sexual activity: Yes  Other Topics Concern   Not on file  Social  History Narrative   Not on file   Social Determinants of Health   Financial Resource Strain: Not on file  Food Insecurity: Not on file  Transportation Needs: Not on file  Physical Activity: Not on file  Stress: Not on file  Social Connections: Not on file  Intimate Partner Violence: Not on file   Family Status  Relation Name Status   Mother  Alive   Father  Alive   [place]   Family History  Problem Relation Age of Onset   Hypertension Mother    Glaucoma Mother    Hyperlipidemia Mother    Hypertension Father    Diabetes Father    Prostate cancer Father        cancer free x 1 year   Breast cancer Cousin    Breast cancer Cousin    Allergies  Allergen Reactions   Septra [Sulfamethoxazole-Trimethoprim] Hives    Patient Care Team: Brooke Sprout, FNP as PCP - General (Family Medicine)   Medications: Outpatient Medications Prior to Visit  Medication Sig   Dapsone (ACZONE) 7.5 % GEL Apply to face every morning for acne.   Multiple Vitamin (MULTIVITAMIN) tablet Take 1 tablet by mouth daily.   Omega-3 Fatty Acids (FISH OIL PO) Take by mouth daily.   SLYND 4 MG TABS TAKE ONE (1) TABLET BY MOUTH DAILY.   [DISCONTINUED] losartan-hydrochlorothiazide (HYZAAR)  50-12.5 MG tablet Take 1 tablet by mouth daily.   [DISCONTINUED] metroNIDAZOLE (FLAGYL) 500 MG tablet Take 1 tablet (500 mg total) by mouth 2 (two) times daily.   No facility-administered medications prior to visit.    Review of Systems   Objective    BP 132/82 (BP Location: Right Arm, Patient Position: Sitting, Cuff Size: Normal)   Pulse 86   Temp 98.2 F (36.8 C) (Oral)   Resp 14   Ht 5' 4"$  (1.626 m)   Wt 157 lb 6.4 oz (71.4 kg)   SpO2 99%   BMI 27.02 kg/m    Physical Exam Vitals and nursing note reviewed.  Constitutional:      General: She is awake. She is not in acute distress.    Appearance: Normal appearance. She is well-developed, well-groomed and overweight. She is not  ill-appearing, toxic-appearing or diaphoretic.  HENT:     Head: Normocephalic and atraumatic.     Jaw: There is normal jaw occlusion. No trismus, tenderness, swelling or pain on movement.     Right Ear: Hearing, tympanic membrane, ear canal and external ear normal. There is no impacted cerumen.     Left Ear: Hearing, tympanic membrane, ear canal and external ear normal. There is no impacted cerumen.     Nose: Nose normal. No congestion or rhinorrhea.     Right Turbinates: Not enlarged, swollen or pale.     Left Turbinates: Not enlarged, swollen or pale.     Right Sinus: No maxillary sinus tenderness or frontal sinus tenderness.     Left Sinus: No maxillary sinus tenderness or frontal sinus tenderness.     Mouth/Throat:     Lips: Pink.     Mouth: Mucous membranes are moist. No injury.     Tongue: No lesions.     Pharynx: Oropharynx is clear. Uvula midline. No pharyngeal swelling, oropharyngeal exudate, posterior oropharyngeal erythema or uvula swelling.     Tonsils: No tonsillar exudate or tonsillar abscesses.  Eyes:     General: Lids are normal. Lids are everted, no foreign bodies appreciated. Vision grossly intact. Gaze aligned appropriately. No allergic shiner or visual field deficit.       Right eye: No discharge.        Left eye: No discharge.     Extraocular Movements: Extraocular movements intact.     Conjunctiva/sclera: Conjunctivae normal.     Right eye: Right conjunctiva is not injected. No exudate.    Left eye: Left conjunctiva is not injected. No exudate.    Pupils: Pupils are equal, round, and reactive to light.  Neck:     Thyroid: No thyroid mass, thyromegaly or thyroid tenderness.     Vascular: No carotid bruit.     Trachea: Trachea normal.  Cardiovascular:     Rate and Rhythm: Normal rate and regular rhythm.     Pulses: Normal pulses.          Carotid pulses are 2+ on the right side and 2+ on the left side.      Radial pulses are 2+ on the right side and 2+ on the  left side.       Dorsalis pedis pulses are 2+ on the right side and 2+ on the left side.       Posterior tibial pulses are 2+ on the right side and 2+ on the left side.     Heart sounds: Normal heart sounds, S1 normal and S2 normal. No murmur heard.    No friction  rub. No gallop.  Pulmonary:     Effort: Pulmonary effort is normal. No respiratory distress.     Breath sounds: Normal breath sounds and air entry. No stridor. No wheezing, rhonchi or rales.  Chest:     Chest wall: No tenderness.     Comments: Breasts: risk and benefit of breast self-exam was discussed, not examined  Abdominal:     General: Abdomen is flat. Bowel sounds are normal. There is no distension.     Palpations: Abdomen is soft. There is no mass.     Tenderness: There is no abdominal tenderness. There is no right CVA tenderness, left CVA tenderness, guarding or rebound.     Hernia: No hernia is present.  Genitourinary:    Comments: Exam deferred; endorses odor for ~1 month. Patient declines STI or vaginal flora screening at this time. Followed by Hadassah Pais MD Musculoskeletal:        General: No swelling, tenderness, deformity or signs of injury. Normal range of motion.     Cervical back: Full passive range of motion without pain, normal range of motion and neck supple. No edema, rigidity or tenderness. No muscular tenderness.     Right lower leg: No edema.     Left lower leg: No edema.  Lymphadenopathy:     Cervical: No cervical adenopathy.     Right cervical: No superficial, deep or posterior cervical adenopathy.    Left cervical: No superficial, deep or posterior cervical adenopathy.  Skin:    General: Skin is warm and dry.     Capillary Refill: Capillary refill takes less than 2 seconds.     Coloration: Skin is not jaundiced or pale.     Findings: No bruising, erythema, lesion or rash.  Neurological:     General: No focal deficit present.     Mental Status: She is alert and oriented to person, place, and  time. Mental status is at baseline.     GCS: GCS eye subscore is 4. GCS verbal subscore is 5. GCS motor subscore is 6.     Sensory: Sensation is intact. No sensory deficit.     Motor: Motor function is intact. No weakness.     Coordination: Coordination is intact. Coordination normal.     Gait: Gait is intact. Gait normal.  Psychiatric:        Attention and Perception: Attention and perception normal.        Mood and Affect: Mood and affect normal.        Speech: Speech normal.        Behavior: Behavior normal. Behavior is cooperative.        Thought Content: Thought content normal.        Cognition and Memory: Cognition and memory normal.        Judgment: Judgment normal.     Last depression screening scores    02/10/2023    8:59 AM 05/21/2022    8:12 AM 02/06/2022    8:59 AM  PHQ 2/9 Scores  PHQ - 2 Score 0 0 0  PHQ- 9 Score 0 1 1   Last fall risk screening    02/10/2023    8:59 AM  [place] in the past year? 0  Number falls in past yr: 0  Injury with Fall? 0   Last Audit-C alcohol use screening    02/10/2023    8:59 AM  Alcohol Use Disorder Test (AUDIT)  1. How often do you have a drink  containing alcohol? 1  2. How many drinks containing alcohol do you have on a typical day when you are drinking? 0  3. How often do you have six or more drinks on one occasion? 0  AUDIT-C Score 1   A score of 3 or more in women, and 4 or more in men indicates increased risk for alcohol abuse, EXCEPT if all of the points are from question 1   No results found for any visits on 02/10/23.  Assessment & Plan    Routine Health Maintenance and Physical Exam  Exercise Activities and Dietary recommendations  Goals   None      There is no immunization history on file for this patient.  Health Maintenance  Topic Date Due   COVID-19 Vaccine (1) Never done   DTaP/Tdap/Td (1 - Tdap) Never done   INFLUENZA VACCINE  03/22/2023 (Originally 07/22/2022)   PAP SMEAR-Modifier   08/04/2023   Hepatitis C Screening  Completed   HIV Screening  Completed   HPV VACCINES  Aged Out    Discussed health benefits of physical activity, and encouraged her to engage in regular exercise appropriate for her age and condition.  Problem List Items Addressed This Visit       Cardiovascular and Mediastinum   Primary hypertension    Chronic, stable Denies side effects or concerns BP stable at goal of <140/<90 Continue Hyzaar 50-12.5      Relevant Medications   losartan-hydrochlorothiazide (HYZAAR) 50-12.5 MG tablet     Other   Annual physical exam - Primary    Continue to recommend screening dental and vision exam Continue home breast checks; will start mammogram at 20 UTD on PAP Things to do to keep yourself healthy  - Exercise at least 30-45 minutes a day, 3-4 days a week.  - Eat a low-fat diet with lots of fruits and vegetables, up to 7-9 servings per day.  - Seatbelts can save your life. Wear them always.  - Smoke detectors on every level of your home, check batteries every year.  - Eye Doctor - have an eye exam every 1-2 years  - Safe sex - if you may be exposed to STDs, use a condom.  - Alcohol -  If you drink, do it moderately, less than 2 drinks per day.  - [place]. Choose someone to speak for you if you are not able.  - Depression is common in our stressful world.If you're feeling down or losing interest in things you normally enjoy, please come in for a visit.  - Violence - If anyone is threatening or hurting you, please call immediately.       Relevant Orders   CBC with Differential/Platelet   Comprehensive Metabolic Panel (CMET)   TSH + free T4   Lipid panel   Return in about 1 year (around 02/12/2024) for annual examination.    Vonna Kotyk, FNP, have reviewed all documentation for this visit. The documentation on 02/10/23 for the exam, diagnosis, procedures, and orders are all accurate and complete.  Brooke Koch, [place] [phone] (phone) [phone] (fax)  [place]

## 2023-02-10 ENCOUNTER — Encounter: Payer: Self-pay | Admitting: Family Medicine

## 2023-02-10 ENCOUNTER — Ambulatory Visit (INDEPENDENT_AMBULATORY_CARE_PROVIDER_SITE_OTHER): Payer: BC Managed Care – PPO | Admitting: Family Medicine

## 2023-02-10 VITALS — BP 132/82 | HR 86 | Temp 98.2°F | Resp 14 | Ht 64.0 in | Wt 157.4 lb

## 2023-02-10 DIAGNOSIS — Z0000 Encounter for general adult medical examination without abnormal findings: Secondary | ICD-10-CM

## 2023-02-10 DIAGNOSIS — I10 Essential (primary) hypertension: Secondary | ICD-10-CM

## 2023-02-10 MED ORDER — LOSARTAN POTASSIUM-HCTZ 50-12.5 MG PO TABS: 1.0000 | ORAL_TABLET | Freq: Every day | ORAL | 3 refills | Status: DC

## 2023-02-10 NOTE — Assessment & Plan Note (Signed)
Chronic, stable Denies side effects or concerns BP stable at goal of <140/<90 Continue Hyzaar 50-12.5

## 2023-02-10 NOTE — Assessment & Plan Note (Signed)
Continue to recommend screening dental and vision exam Continue home breast checks; will start mammogram at 20 UTD on PAP Things to do to keep yourself healthy  - Exercise at least 30-45 minutes a day, 3-4 days a week.  - Eat a low-fat diet with lots of fruits and vegetables, up to 7-9 servings per day.  - Seatbelts can save your life. Wear them always.  - Smoke detectors on every level of your home, check batteries every year.  - Eye Doctor - have an eye exam every 1-2 years  - Safe sex - if you may be exposed to STDs, use a condom.  - Alcohol -  If you drink, do it moderately, less than 2 drinks per day.  - [place]. Choose someone to speak for you if you are not able.  - Depression is common in our stressful world.If you're feeling down or losing interest in things you normally enjoy, please come in for a visit.  - Violence - If anyone is threatening or hurting you, please call immediately.

## 2023-02-11 LAB — CBC WITH DIFFERENTIAL/PLATELET
Basophils Absolute: 0 10*3/uL (ref 0.0–0.2)
Basos: 0 %
EOS (ABSOLUTE): 0 10*3/uL (ref 0.0–0.4)
Eos: 1 %
Hematocrit: 40.9 % (ref 34.0–46.6)
Hemoglobin: 13.8 g/dL (ref 11.1–15.9)
Immature Grans (Abs): 0 10*3/uL (ref 0.0–0.1)
Immature Granulocytes: 0 %
Lymphocytes Absolute: 1.5 10*3/uL (ref 0.7–3.1)
Lymphs: 22 %
MCH: 27.8 pg (ref 26.6–33.0)
MCHC: 33.7 g/dL (ref 31.5–35.7)
MCV: 82 fL (ref 79–97)
Monocytes Absolute: 0.6 10*3/uL (ref 0.1–0.9)
Monocytes: 9 %
Neutrophils Absolute: 4.6 10*3/uL (ref 1.4–7.0)
Neutrophils: 68 %
Platelets: 363 10*3/uL (ref 150–450)
RBC: 4.97 x10E6/uL (ref 3.77–5.28)
RDW: 12.5 % (ref 11.7–15.4)
WBC: 6.8 10*3/uL (ref 3.4–10.8)

## 2023-02-11 LAB — LIPID PANEL
Chol/HDL Ratio: 4.2 ratio (ref 0.0–4.4)
Cholesterol, Total: 220 mg/dL — ABNORMAL HIGH (ref 100–199)
HDL: 53 mg/dL (ref >39)
LDL Chol Calc (NIH): 149 mg/dL — ABNORMAL HIGH (ref 0–99)
Triglycerides: 103 mg/dL (ref 0–149)
VLDL Cholesterol Cal: 18 mg/dL (ref 5–40)

## 2023-02-11 LAB — COMPREHENSIVE METABOLIC PANEL
ALT: 26 IU/L (ref 0–32)
AST: 21 IU/L (ref 0–40)
Albumin/Globulin Ratio: 2.2 (ref 1.2–2.2)
Albumin: 4.9 g/dL (ref 3.9–4.9)
Alkaline Phosphatase: 54 IU/L (ref 44–121)
BUN/Creatinine Ratio: 24 — ABNORMAL HIGH (ref 9–23)
BUN: 16 mg/dL (ref 6–20)
Bilirubin Total: 0.4 mg/dL (ref 0.0–1.2)
CO2: 24 mmol/L (ref 20–29)
Calcium: 9.5 mg/dL (ref 8.7–10.2)
Chloride: 101 mmol/L (ref 96–106)
Creatinine, Ser: 0.67 mg/dL (ref 0.57–1.00)
Globulin, Total: 2.2 g/dL (ref 1.5–4.5)
Glucose: 86 mg/dL (ref 70–99)
Potassium: 4.2 mmol/L (ref 3.5–5.2)
Sodium: 138 mmol/L (ref 134–144)
Total Protein: 7.1 g/dL (ref 6.0–8.5)
eGFR: 115 mL/min/{1.73_m2} (ref >59)

## 2023-02-11 LAB — TSH+FREE T4
Free T4: 1.15 ng/dL (ref 0.82–1.77)
TSH: 1.86 u[IU]/mL (ref 0.450–4.500)

## 2023-02-11 NOTE — Progress Notes (Signed)
Cholesterol is improved; however, continues to remain elevated with total >200 and LDL/bad >100. I continue to recommend diet low in saturated fat and regular exercise - 30 min at least 5 times per week  All other labs are normal and stable.  Gwyneth Sprout, [address] [place], [place] [address] [phone] (phone) [phone] (fax) [place]

## 2023-03-03 ENCOUNTER — Ambulatory Visit: Payer: Self-pay | Admitting: *Deleted

## 2023-03-03 ENCOUNTER — Encounter: Payer: Self-pay | Admitting: Physician Assistant

## 2023-03-03 ENCOUNTER — Ambulatory Visit (INDEPENDENT_AMBULATORY_CARE_PROVIDER_SITE_OTHER): Payer: BC Managed Care – PPO | Admitting: Physician Assistant

## 2023-03-03 VITALS — BP 149/87 | HR 85

## 2023-03-03 DIAGNOSIS — R079 Chest pain, unspecified: Secondary | ICD-10-CM

## 2023-03-03 DIAGNOSIS — R519 Headache, unspecified: Secondary | ICD-10-CM

## 2023-03-03 DIAGNOSIS — I10 Essential (primary) hypertension: Secondary | ICD-10-CM

## 2023-03-03 NOTE — Progress Notes (Signed)
I,Sha'taria Tyson,acting as a Education administrator for [employer], PA-C.,have documented all relevant documentation on the behalf of Mikey Kirschner, PA-C,as directed by  Mikey Kirschner, PA-C while in the presence of Mikey Kirschner, PA-C.   Established patient visit   Patient: Brooke Koch   DOB: 07/19/1986   37 y.o. Female  MRN: [id number] Visit Date: 03/03/2023  Today's healthcare provider: Mikey Kirschner, PA-C   Cc. HTN at home, chest pain  Subjective    HPI   Pt reports chest pain L side of chest since x 3 days, intermittent. Denies worsening with activity. Denies diaphoresis, lightheadedness/dizziness. Reports some tingling of her left hand intermittently.   She also reports headaches x 3 days that have been persistent, goes away with ibuprofen but then back after a while. Reports checking her blood pressure at this time and noticing higher numbers than normal.  Hypertension, follow-up  BP Readings from Last 3 Encounters:  03/03/23 (!) 149/87  02/10/23 132/82  01/05/23 128/87   Wt Readings from Last 3 Encounters:  02/10/23 157 lb 6.4 oz (71.4 kg)  01/05/23 157 lb 6.4 oz (71.4 kg)  08/20/22 152 lb (68.9 kg)     She was last seen for hypertension 3 weeks ago.  BP at that visit was 132/82. Management since that visit includes continue Hyzaar 50-12.5.  Outside blood pressures are 130/94 this morning.  Symptoms:Headaches, chest pain.  Pertinent labs Lab Results  Component Value Date   CHOL 220 (H) 02/10/2023   HDL 53 02/10/2023   LDLCALC 149 (H) 02/10/2023   TRIG 103 02/10/2023   CHOLHDL 4.2 02/10/2023   Lab Results  Component Value Date   NA 138 02/10/2023   K 4.2 02/10/2023   CREATININE 0.67 02/10/2023   EGFR 115 02/10/2023   GLUCOSE 86 02/10/2023   TSH 1.860 02/10/2023     The ASCVD Risk score (Arnett DK, et al., 2019) failed to calculate for the following reasons:   The 2019 ASCVD risk score is only valid for ages 48 to  63  ---------------------------------------------------------------------------------------------------   Medications: Outpatient Medications Prior to Visit  Medication Sig   Dapsone (ACZONE) 7.5 % GEL Apply to face every morning for acne.   losartan-hydrochlorothiazide (HYZAAR) 50-12.5 MG tablet Take 1 tablet by mouth daily.   Multiple Vitamin (MULTIVITAMIN) tablet Take 1 tablet by mouth daily.   Omega-3 Fatty Acids (FISH OIL PO) Take by mouth daily.   SLYND 4 MG TABS TAKE ONE (1) TABLET BY MOUTH DAILY.   No facility-administered medications prior to visit.    Review of Systems  Constitutional:  Negative for fatigue and fever.  Respiratory:  Negative for cough and shortness of breath.   Cardiovascular:  Positive for chest pain. Negative for leg swelling.  Gastrointestinal:  Negative for abdominal pain.  Neurological:  Positive for headaches. Negative for dizziness.      Objective    BP (!) 149/87 (BP Location: Right Arm, Patient Position: Sitting, Cuff Size: Normal)   Pulse 85   SpO2 99%    Physical Exam Constitutional:      General: She is awake.     Appearance: She is well-developed.  HENT:     Head: Normocephalic.  Eyes:     Conjunctiva/sclera: Conjunctivae normal.  Cardiovascular:     Rate and Rhythm: Normal rate and regular rhythm.     Heart sounds: Normal heart sounds. No murmur heard. Pulmonary:     Effort: Pulmonary effort is normal.     Breath sounds: Normal  breath sounds. No wheezing, rhonchi or rales.  Skin:    General: Skin is warm.  Neurological:     Mental Status: She is alert and oriented to person, place, and time.  Psychiatric:        Attention and Perception: Attention normal.        Mood and Affect: Mood normal.        Speech: Speech normal.        Behavior: Behavior is cooperative.      No results found for any visits on 03/03/23.  Assessment & Plan     Chest pain  EKG NSR with no T wave abnormalities / ST elevations or  depressions Reassured based on symptoms and normal EKG pain is likely not cardiac in nature; but advised sometimes EKG is normal and cardiac enzymes can be elevated  Advised warning signs of MI -- any changes to go to ED  Discussed given family history of cardiac and DM, could do a cardiac calcium score to further evaluate risk; pt will consider  For pain, advised trying ibuprofen to see if this brings relief, and also trying TUMS to eval potential for referred GERD   2. HTN Elevated in office, advised pt to continue to monitor and if she is consistently seeing > 130/90 at home to f/b in office  3. Headache Advised pt try an antihistamine to see if this relieves symptoms  Return if symptoms worsen or fail to improve.      I, Mikey Kirschner, PA-C have reviewed all documentation for this visit. The documentation on  03/03/23  for the exam, diagnosis, procedures, and orders are all accurate and complete.  Mikey Kirschner, PA-C [facility] [address] [place], [place], [address] Office: [phone] Fax: [place]

## 2023-03-03 NOTE — Telephone Encounter (Addendum)
Chief Complaint: chest pain headache elevated BP Symptoms: headache since Sunday left chest pain since Sunday comes and goes and "a little now ". Tingling feet and fingers. BP 130/94 this am.  Frequency: 03/01/23 Pertinent Negatives: Patient denies chest pain constant, no difficulty breathing no nausea no sweating, no radiating pain in arm neck or jaw. Denies dizziness no lightheadedness.  Disposition: '[]'$ ED /'[]'$ Urgent Care (no appt availability in office) / '[x]'$ Appointment(In office/virtual)/ '[]'$  [facility]/ '[]'$ Home Care/ '[]'$ Refused Recommended Disposition /'[]'$ [facility]/ '[]'$  Follow-up with PCP Additional Notes:   Appt scheduled today. Recommended if sx worsen go to ED . Please advise if ED if advised rather than appt today .  Contacted Carol in office ,after lunch to notify appt scheduled and if PCP or provider requesting patient to go to ED instead.   Reason for Disposition  [1] Chest pain lasts > 5 minutes AND [2] occurred > 3 days ago (72 hours) AND [3] NO chest pain or cardiac symptoms now    Does have chest discomfort "a little" comes and goes  Answer Assessment - Initial Assessment Questions 1. LOCATION: "Where does it hurt?"       Left side of chest pain  2. RADIATION: "Does the pain go anywhere else?" (e.g., into neck, jaw, arms, back)     no 3. ONSET: "When did the chest pain begin?" (Minutes, hours or days)      Sunday 03/01/23 4. PATTERN: "Does the pain come and go, or has it been constant since it started?"  "Does it get worse with exertion?"      Comes and goes  5. DURATION: "How long does it last" (e.g., seconds, minutes, hours)     Na  6. SEVERITY: "How bad is the pain?"  (e.g., Scale 1-10; mild, moderate, or severe)    - MILD (1-3): doesn't interfere with normal activities     - MODERATE (4-7): interferes with normal activities or awakens from sleep    - SEVERE (8-10): excruciating pain, unable to do any normal activities       Mild  7. CARDIAC RISK  FACTORS: "Do you have any history of heart problems or risk factors for heart disease?" (e.g., angina, prior heart attack; diabetes, high blood pressure, high cholesterol, smoker, or strong family history of heart disease)     HTN 8. PULMONARY RISK FACTORS: "Do you have any history of lung disease?"  (e.g., blood clots in lung, asthma, emphysema, birth control pills)     na 9. CAUSE: "What do you think is causing the chest pain?"     Not sure  10. OTHER SYMPTOMS: "Do you have any other symptoms?" (e.g., dizziness, nausea, vomiting, sweating, fever, difficulty breathing, cough)       Left chest discomfort comes and goes. BP 130/94 today . Headache  tingling feet and fingers  11. PREGNANCY: "Is there any chance you are pregnant?" "When was your last menstrual period?"       na  Answer Assessment - Initial Assessment Questions 1. LOCATION: "Where does it hurt?"      *No Answer* 2. ONSET: "When did the headache start?" (Minutes, hours or days)      *No Answer* 3. PATTERN: "Does the pain come and go, or has it been constant since it started?"     *No Answer* 4. SEVERITY: "How bad is the pain?" and "What does it keep you from doing?"  (e.g., Scale 1-10; mild, moderate, or severe)   - MILD (1-3): doesn't  interfere with normal activities    - MODERATE (4-7): interferes with normal activities or awakens from sleep    - SEVERE (8-10): excruciating pain, unable to do any normal activities        *No Answer* 5. RECURRENT SYMPTOM: "Have you ever had headaches before?" If Yes, ask: "When was the last time?" and "What happened that time?"      *No Answer* 6. CAUSE: "What do you think is causing the headache?"     *No Answer* 7. MIGRAINE: "Have you been diagnosed with migraine headaches?" If Yes, ask: "Is this headache similar?"      *No Answer* 8. HEAD INJURY: "Has there been any recent injury to the head?"      *No Answer* 9. OTHER SYMPTOMS: "Do you have any other symptoms?" (fever, stiff neck, eye  pain, sore throat, cold symptoms)     *No Answer* 10. PREGNANCY: "Is there any chance you are pregnant?" "When was your last menstrual period?"       *No Answer*  Protocols used: Chest Pain-A-AH, Headache-A-AH

## 2023-03-03 NOTE — Telephone Encounter (Signed)
Ok to be seen in office. Appt is in <10 min anyways.

## 2023-03-25 ENCOUNTER — Encounter: Payer: Self-pay | Admitting: Obstetrics and Gynecology

## 2023-04-10 NOTE — Progress Notes (Signed)
    GYNECOLOGY PROGRESS NOTE  Subjective:    Patient ID: Brooke Koch, female    DOB: Oct 19, 1986, 37 y.o.   MRN: [id number]  HPI  Patient is a 37 y.o. G21P1001 female who presents for evaluation of irregular menstrual cycles.  Notes that her cycles come on and last for up to 15 days, alternating with light to heavy bleeding. May stop bleeding for 4-5 days, then bleed again dark red blood. Does note some passage of clots. This has been ongoing for at least a year, progressively u getting worse. Notes that she does not know when her cycle is actually supposed to start anymore.  Currently on Slynd, changed last year from combined OCPs due to dx of HTN.  Currently on Hyzaar.   The following portions of the patient's history were reviewed and updated as appropriate: allergies, current medications, past family history, past medical history, past social history, past surgical history, and problem list.  Review of Systems Pertinent items noted in HPI and remainder of comprehensive ROS otherwise negative.   Objective:   Blood pressure 117/78, pulse 69, resp. rate 16, height 5\' 4"  (1.626 m), weight 154 lb 8 oz (70.1 kg), last menstrual period 03/12/2023. Body mass index is 26.52 kg/m. General appearance: alert, cooperative, and no distress Abdomen: soft, non-tender; bowel sounds normal; no masses,  no organomegaly Pelvic: deferred Extremities: extremities normal, atraumatic, no cyanosis or edema Neurologic: Grossly normal   Assessment:   1. Breakthrough bleeding on OCPs      Plan:   Discussed options for management of breakthrough bleeding on Slynd, including changing to different OCP or contraceptive method all together, taking 1-2 month hiatus from birth control to allow body to self regulate and then resuming med, or supplemental estrogen for 2 weeks. Patient desires to take a break from the birth control, and would like to try another IUD in the near future. Notes concerns that she may  bleed with IUD, although she had pretty good success with her last use.  Discussed possibility of irregular bleeding in the first few months with IUD insertion. Also noted options to help with bleeding if encountered. RTC in 2 months for IUD insertion if desired.    A total of 15 minutes were spent face-to-face with the patient during this encounter and over half of that time involved counseling and coordination of care.   Hildred Laser, MD  [facility]

## 2023-04-14 ENCOUNTER — Ambulatory Visit (INDEPENDENT_AMBULATORY_CARE_PROVIDER_SITE_OTHER): Payer: BC Managed Care – PPO | Admitting: Obstetrics and Gynecology

## 2023-04-14 ENCOUNTER — Encounter: Payer: Self-pay | Admitting: Obstetrics and Gynecology

## 2023-04-14 VITALS — BP 117/78 | HR 69 | Resp 16 | Ht 64.0 in | Wt 154.5 lb

## 2023-04-14 DIAGNOSIS — N921 Excessive and frequent menstruation with irregular cycle: Secondary | ICD-10-CM

## 2023-08-25 NOTE — Patient Instructions (Addendum)

## 2023-08-25 NOTE — Progress Notes (Signed)
GYNECOLOGY ANNUAL PHYSICAL EXAM PROGRESS NOTE  Subjective:    Brooke Koch is a 37 y.o. G9P1001 female who presents for an annual exam. The patient has no complaints today. The patient is sexually active. The patient participates in regular exercise: yes. Has the patient ever been transfused or tattooed?: yes. The patient reports that there is not domestic violence in her life.   Notes that her cycles have finally regulated after coming off her birth control, no more break through bleeding.   Also desires to have STD screening.  Denies concerns for recent exposure but just desires to be checked.   Menstrual History: Menarche age: 83 Patient's last menstrual period was 08/15/2023. Period Cycle (Days): 28 Period Duration (Days): 5 Period Pattern: Regular Menstrual Flow: Moderate Menstrual Control: Tampon, Maxi pad Menstrual Control Change Freq (Hours): 3 Dysmenorrhea: (!) Moderate Dysmenorrhea Symptoms: Cramping, Headache     Gynecologic History:  Contraception: condoms History of STI's: Denies Last Pap:: 08/03/2020.  Results were: normal.  Denies h/o abnormal pap smears.  Last mammogram: Not age appropriate    OB History  Gravida Para Term Preterm AB Living  1 1 1  0 0 1  SAB IAB Ectopic Multiple Live Births  0 0 0 0 1    # Outcome Date GA Lbr Len/2nd Weight Sex Type Anes PTL Lv  1 Term 06/09/11 [redacted]w[redacted]d  5 lb 10 oz (2.551 kg) F Vag-Spont   LIV     Birth Comments: Cleft Hand     Past Medical History:  Diagnosis Date   Gastritis    History of Helicobacter pylori infection    Wears contact lenses     Past Surgical History:  Procedure Laterality Date   COLONOSCOPY WITH PROPOFOL N/A 10/29/2020   Procedure: COLONOSCOPY WITH BIOPSY;  Surgeon: Midge Minium, MD;  Location: [facility];  Service: Endoscopy;  Laterality: N/A;   ESOPHAGOGASTRODUODENOSCOPY  12/2009    Family History  Problem Relation Age of Onset   Hypertension Mother    Glaucoma Mother     Hyperlipidemia Mother    Hypertension Father    Diabetes Father    Prostate cancer Father        cancer free x 1 year   Breast cancer Cousin    Breast cancer Cousin     Social History   Socioeconomic History   Marital status: Single    Spouse name: Not on file   Number of children: Not on file   Years of education: Not on file   Highest education level: Not on file  Occupational History   Not on file  Tobacco Use   Smoking status: Never   Smokeless tobacco: Never  Vaping Use   Vaping status: Never Used  Substance and Sexual Activity   Alcohol use: Yes    Alcohol/week: 0.0 standard drinks of alcohol    Comment: social drinker   Drug use: No   Sexual activity: Yes  Other Topics Concern   Not on file  Social History Narrative   Not on file   Social Determinants of Health   Financial Resource Strain: Not on file  Food Insecurity: Not on file  Transportation Needs: Not on file  Physical Activity: Not on file  Stress: Not on file  Social Connections: Not on file  Intimate Partner Violence: Not on file    Current Outpatient Medications on File Prior to Visit  Medication Sig Dispense Refill   Dapsone (ACZONE) 7.5 % GEL Apply to face every morning  for acne. 90 g 3   losartan-hydrochlorothiazide (HYZAAR) 50-12.5 MG tablet Take 1 tablet by mouth daily. 90 tablet 3   Multiple Vitamin (MULTIVITAMIN) tablet Take 1 tablet by mouth daily.     Omega-3 Fatty Acids (FISH OIL PO) Take by mouth daily.     SLYND 4 MG TABS TAKE ONE (1) TABLET BY MOUTH DAILY. 84 tablet 3   No current facility-administered medications on file prior to visit.    Allergies  Allergen Reactions   Septra [Sulfamethoxazole-Trimethoprim] Hives     Review of Systems Constitutional: negative for chills, fatigue, fevers and sweats Eyes: negative for irritation, redness and visual disturbance Ears, nose, mouth, throat, and face: negative for hearing loss, nasal congestion, snoring and  tinnitus Respiratory: negative for asthma, cough, sputum Cardiovascular: negative for chest pain, dyspnea, exertional chest pressure/discomfort, irregular heart beat, palpitations and syncope Gastrointestinal: negative for abdominal pain, change in bowel habits, nausea and vomiting Genitourinary: negative for abnormal menstrual periods, genital lesions, sexual problems and vaginal discharge, dysuria and urinary incontinence Integument/breast: negative for breast lump, breast tenderness and nipple discharge Hematologic/lymphatic: negative for bleeding and easy bruising Musculoskeletal:negative for back pain and muscle weakness Neurological: negative for dizziness, headaches, vertigo and weakness Endocrine: negative for diabetic symptoms including polydipsia, polyuria and skin dryness Allergic/Immunologic: negative for hay fever and urticaria      Objective:  Blood pressure 133/76, pulse 68, resp. rate 16, height 5\' 4"  (1.626 m), weight 154 lb 1.6 oz (69.9 kg), last menstrual period 08/15/2023. Body mass index is 26.45 kg/m.  General Appearance:    Alert, cooperative, no distress, appears stated age  Head:    Normocephalic, without obvious abnormality, atraumatic  Eyes:    PERRL, conjunctiva/corneas clear, EOM's intact, both eyes  Ears:    Normal external ear canals, both ears  Nose:   Nares normal, septum midline, mucosa normal, no drainage or sinus tenderness  Throat:   Lips, mucosa, and tongue normal; teeth and gums normal  Neck:   Supple, symmetrical, trachea midline, no adenopathy; thyroid: no enlargement/tenderness/nodules; no carotid bruit or JVD  Back:     Symmetric, no curvature, ROM normal, no CVA tenderness  Lungs:     Clear to auscultation bilaterally, respirations unlabored  Chest Wall:    No tenderness or deformity   Heart:    Regular rate and rhythm, S1 and S2 normal, no murmur, rub or gallop  Breast Exam:    No tenderness, masses, or nipple abnormality  Abdomen:     Soft,  non-tender, bowel sounds active all four quadrants, no masses, no organomegaly.    Genitalia:    Pelvic:external genitalia normal, vagina without lesions, discharge, or tenderness, rectovaginal septum  normal. Cervix normal in appearance, no cervical motion tenderness, no adnexal masses or tenderness.  Uterus normal size, shape, mobile, regular contours, nontender.  Rectal:    Normal external sphincter.  No hemorrhoids appreciated. Internal exam not done.   Extremities:   Extremities normal, atraumatic, no cyanosis or edema  Pulses:   2+ and symmetric all extremities  Skin:   Skin color, texture, turgor normal, no rashes or lesions  Lymph nodes:   Cervical, supraclavicular, and axillary nodes normal  Neurologic:   CNII-XII intact, normal strength, sensation and reflexes throughout   .  Labs:  Lab Results  Component Value Date   WBC 6.8 02/10/2023   HGB 13.8 02/10/2023   HCT 40.9 02/10/2023   MCV 82 02/10/2023   PLT 363 02/10/2023    Lab Results  Component Value Date   CREATININE 0.67 02/10/2023   BUN 16 02/10/2023   NA 138 02/10/2023   K 4.2 02/10/2023   CL 101 02/10/2023   CO2 24 02/10/2023    Lab Results  Component Value Date   ALT 26 02/10/2023   AST 21 02/10/2023   ALKPHOS 54 02/10/2023   BILITOT 0.4 02/10/2023    Lab Results  Component Value Date   TSH 1.860 02/10/2023     Assessment:   1. Well woman exam with routine gynecological exam   2. Essential hypertension   3. Dyslipidemia   4. Screen for STD (sexually transmitted disease)      Plan:  Blood tests: Performed by PCP in February. Breast self exam technique reviewed and patient encouraged to perform self-exam monthly. Contraception: none.  Discussed healthy lifestyle modifications. Mammogram: To begin screenings next year.  Pap smear ordered. Patient desires STD screening, ordered with pap smear. Declines serology screening.  Dyslipidemia and HTN managed by PCP.  Follow up in 1 year for annual  exam   Hildred Laser, MD [facility]

## 2023-08-26 ENCOUNTER — Other Ambulatory Visit (HOSPITAL_COMMUNITY)
Admission: RE | Admit: 2023-08-26 | Discharge: 2023-08-26 | Disposition: A | Discharge status: Home / Self Care | Payer: BC Managed Care – PPO | Source: Ambulatory Visit | Attending: Obstetrics and Gynecology | Admitting: Obstetrics and Gynecology

## 2023-08-26 ENCOUNTER — Ambulatory Visit (INDEPENDENT_AMBULATORY_CARE_PROVIDER_SITE_OTHER): Payer: BC Managed Care – PPO | Admitting: Obstetrics and Gynecology

## 2023-08-26 ENCOUNTER — Encounter: Payer: Self-pay | Admitting: Obstetrics and Gynecology

## 2023-08-26 VITALS — BP 133/76 | HR 68 | Resp 16 | Ht 64.0 in | Wt 154.1 lb

## 2023-08-26 DIAGNOSIS — Z113 Encounter for screening for infections with a predominantly sexual mode of transmission: Secondary | ICD-10-CM | POA: Insufficient documentation

## 2023-08-26 DIAGNOSIS — Z124 Encounter for screening for malignant neoplasm of cervix: Secondary | ICD-10-CM | POA: Insufficient documentation

## 2023-08-26 DIAGNOSIS — Z01419 Encounter for gynecological examination (general) (routine) without abnormal findings: Secondary | ICD-10-CM

## 2023-08-26 DIAGNOSIS — E785 Hyperlipidemia, unspecified: Secondary | ICD-10-CM

## 2023-08-26 DIAGNOSIS — I10 Essential (primary) hypertension: Secondary | ICD-10-CM

## 2023-08-26 NOTE — Addendum Note (Signed)
Addended by: Tommie Raymond on: 08/26/2023 08:50 AM   Modules accepted: Orders

## 2023-08-28 LAB — CYTOLOGY - PAP
Adequacy: ABSENT
Chlamydia: NEGATIVE
Comment: NEGATIVE
Comment: NEGATIVE
Comment: NORMAL
Diagnosis: NEGATIVE
High risk HPV: NEGATIVE
Neisseria Gonorrhea: NEGATIVE

## 2023-09-11 DIAGNOSIS — Z113 Encounter for screening for infections with a predominantly sexual mode of transmission: Secondary | ICD-10-CM | POA: Diagnosis not present

## 2024-01-05 DIAGNOSIS — R300 Dysuria: Secondary | ICD-10-CM | POA: Diagnosis not present

## 2024-01-05 DIAGNOSIS — Z113 Encounter for screening for infections with a predominantly sexual mode of transmission: Secondary | ICD-10-CM | POA: Diagnosis not present

## 2024-01-07 DIAGNOSIS — R300 Dysuria: Secondary | ICD-10-CM | POA: Diagnosis not present

## 2024-01-07 DIAGNOSIS — N760 Acute vaginitis: Secondary | ICD-10-CM | POA: Diagnosis not present

## 2024-01-09 DIAGNOSIS — Z113 Encounter for screening for infections with a predominantly sexual mode of transmission: Secondary | ICD-10-CM | POA: Diagnosis not present

## 2024-01-11 ENCOUNTER — Ambulatory Visit (INDEPENDENT_AMBULATORY_CARE_PROVIDER_SITE_OTHER): Payer: BC Managed Care – PPO | Admitting: Family Medicine

## 2024-01-11 ENCOUNTER — Other Ambulatory Visit: Payer: Self-pay | Admitting: Family Medicine

## 2024-01-11 ENCOUNTER — Encounter: Payer: Self-pay | Admitting: Family Medicine

## 2024-01-11 ENCOUNTER — Other Ambulatory Visit (HOSPITAL_COMMUNITY)
Admission: RE | Admit: 2024-01-11 | Discharge: 2024-01-11 | Disposition: A | Discharge status: Home / Self Care | Payer: BC Managed Care – PPO | Source: Ambulatory Visit | Attending: Family Medicine | Admitting: Family Medicine

## 2024-01-11 VITALS — BP 117/89 | HR 90 | Resp 16 | Ht 64.0 in | Wt 149.5 lb

## 2024-01-11 DIAGNOSIS — N898 Other specified noninflammatory disorders of vagina: Secondary | ICD-10-CM | POA: Insufficient documentation

## 2024-01-11 DIAGNOSIS — N3091 Cystitis, unspecified with hematuria: Secondary | ICD-10-CM

## 2024-01-11 DIAGNOSIS — R109 Unspecified abdominal pain: Secondary | ICD-10-CM | POA: Diagnosis not present

## 2024-01-11 DIAGNOSIS — B3731 Acute candidiasis of vulva and vagina: Secondary | ICD-10-CM | POA: Diagnosis not present

## 2024-01-11 LAB — POCT URINALYSIS DIPSTICK
Bilirubin, UA: NEGATIVE
Glucose, UA: NEGATIVE
Ketones, UA: NEGATIVE
Nitrite, UA: NEGATIVE
Protein, UA: NEGATIVE
Spec Grav, UA: <=1.005 — AB (ref 1.010–1.025)
Urobilinogen, UA: 0.2 U/dL
pH, UA: 7 (ref 5.0–8.0)

## 2024-01-11 MED ORDER — NITROFURANTOIN MONOHYD MACRO 100 MG PO CAPS: 100.0000 mg | ORAL_CAPSULE | Freq: Two times a day (BID) | ORAL | 0 refills | Status: AC

## 2024-01-11 NOTE — Progress Notes (Signed)
Acute visit   Patient: Brooke Koch   DOB: 1986/01/25   38 y.o. Female  MRN: [id number]  Chief Complaint  Patient presents with   Urinary Tract Infection   Subjective    Discussed the use of AI scribe software for clinical note transcription with the patient, who gave verbal consent to proceed.  History of Present Illness   Brooke Koch, a patient with a history of recurrent BV and UTIs, presents with ongoing vaginal discharge and lower back pain. She has been on medication for BV since a positive test at an urgent care center a week ago. Despite nearing the end of her treatment course, she reports persistent discharge, which has decreased slightly and changed to a more white color. She denies any itching or discomfort associated with the discharge, but notes some irritation at the vaginal opening when wiping. She also mentions lower back pain and has used a home UTI test, which showed positive leukocytes but negative nitrate. She denies any dysuria or urinary urgency and reports no change in urinary frequency, although she notes that she drinks a lot of water. She also mentions a new sexual partner about eight days ago, with whom she did not use protection.        Review of Systems  Objective    BP 117/89   Pulse 90   Resp 16   Ht 5\' 4"  (1.626 m)   Wt 149 lb 8 oz (67.8 kg)   SpO2 100%   BMI 25.66 kg/m  Physical Exam Vitals reviewed.  Constitutional:      General: She is not in acute distress.    Appearance: She is well-developed.  HENT:     Head: Normocephalic and atraumatic.  Eyes:     General: No scleral icterus.    Conjunctiva/sclera: Conjunctivae normal.  Cardiovascular:     Rate and Rhythm: Normal rate and regular rhythm.     Heart sounds: Normal heart sounds. No murmur heard. Pulmonary:     Effort: Pulmonary effort is normal. No respiratory distress.     Breath sounds: Normal breath sounds. No wheezing or rales.  Abdominal:     General: There is no  distension.     Palpations: Abdomen is soft.     Tenderness: There is no abdominal tenderness. There is no right CVA tenderness, left CVA tenderness, guarding or rebound.  Skin:    General: Skin is warm and dry.  Neurological:     Mental Status: She is alert.       Results for orders placed or performed in visit on 01/11/24  POCT Urinalysis Dipstick  Result Value Ref Range   Color, UA yellow    Clarity, UA Cloudy    Glucose, UA Negative Negative   Bilirubin, UA Negative    Ketones, UA Negative    Spec Grav, UA <=1.005 (A) 1.010 - 1.025   Blood, UA Large (A)    pH, UA 7.0 5.0 - 8.0   Protein, UA Negative Negative   Urobilinogen, UA 0.2 0.2 or 1.0 E.U./dL   Nitrite, UA Negative    Leukocytes, UA Small (1+) (A) Negative   Appearance     Odor      Assessment & Plan     Problem List Items Addressed This Visit   None Visit Diagnoses       Cystitis with hematuria    -  Primary   Relevant Orders   Urine Culture   Urinalysis, microscopic only  Flank pain       Relevant Orders   POCT Urinalysis Dipstick (Completed)     Vaginal discharge       Relevant Orders   Cervicovaginal ancillary only           Urinary Tract Infection (UTI) Positive leukocytes and microscopic hematuria suggestive of UTI. No dysuria, increased frequency, or urgency, +pain.  Possible that luekocytes in urine are from vaginal discharge.  Negative nitrates. Recurrent UTIs with successful treatment using Macrobid. Urine culture will confirm diagnosis and guide antibiotic sensitivity. Persistent microscopic hematuria will require further evaluation in six weeks. - Prescribe Macrobid 100 mg, twice daily with food for 5 days - Send urine sample for culture and sensitivity - Send urine sample for microscopic analysis to confirm hematuria - Reevaluate in six weeks if microscopic hematuria persists  Vaginal Discharge Persistent discharge with recent change to white and watery. Mild irritation noted  today. Recent unprotected sexual encounter raises concern for STIs. Self-collection (patient chose this over pelvic exam)  of vaginal swab will test for yeast, gonorrhea, chlamydia, and trichomonas. Results will guide further treatment.  - Self-collect vaginal swab for BV, yeast, gonorrhea, chlamydia, and trichomonas - Await results before initiating further treatment - Consider fluconazole if yeast infection is confirmed post-UTI treatment  Bacterial Vaginosis (BV) Currently on metronidazole since Tuesday for BV diagnosed at urgent care. Discharge has decreased but persists. Recurrent BV with past effective treatment using metronidazole. Discussed that metronidazole may not cover all BV strains and alternative treatments may be necessary if BV persists. Reswabbing will confirm resolution or persistence. - Complete current course of metronidazole - Reswab for BV to confirm resolution or persistence - Consider alternative treatment if BV persists       Meds ordered this encounter  Medications   nitrofurantoin, macrocrystal-monohydrate, (MACROBID) 100 MG capsule    Sig: Take 1 capsule (100 mg total) by mouth 2 (two) times daily for 5 days.    Dispense:  10 capsule    Refill:  0     Return if symptoms worsen or fail to improve.      Shirlee Latch, MD  [facility] [phone] (phone) [phone] (fax)  [facility]

## 2024-01-12 ENCOUNTER — Encounter: Payer: Self-pay | Admitting: Family Medicine

## 2024-01-12 ENCOUNTER — Ambulatory Visit (INDEPENDENT_AMBULATORY_CARE_PROVIDER_SITE_OTHER): Payer: BC Managed Care – PPO | Admitting: Family Medicine

## 2024-01-12 VITALS — BP 120/92 | HR 68 | Temp 98.2°F | Ht 64.0 in | Wt 146.0 lb

## 2024-01-12 DIAGNOSIS — N9089 Other specified noninflammatory disorders of vulva and perineum: Secondary | ICD-10-CM

## 2024-01-12 DIAGNOSIS — Z113 Encounter for screening for infections with a predominantly sexual mode of transmission: Secondary | ICD-10-CM

## 2024-01-12 LAB — URINALYSIS, MICROSCOPIC ONLY
Bacteria, UA: NONE SEEN
Casts: NONE SEEN /[LPF]

## 2024-01-12 MED ORDER — VALACYCLOVIR HCL 1 G PO TABS: 1000.0000 mg | ORAL_TABLET | Freq: Two times a day (BID) | ORAL | 0 refills | Status: AC

## 2024-01-12 NOTE — Progress Notes (Signed)
Acute visit   Patient: Brooke Koch   DOB: August 25, 1986   38 y.o. Female  MRN: [id number]  Chief Complaint  Patient presents with   Follow-up    Pt stated--still having discharge, have sore on the right side of the vaginal painful when wiping and notice last night.   Subjective    Discussed the use of AI scribe software for clinical note transcription with the patient, who gave verbal consent to proceed.  History of Present Illness   The patient, with a recent history of urinary discomfort and discharge, presents with a new onset vaginal ulcer. She noticed the lesion yesterday evening when she felt soreness during urination. She denies any prior history of genital herpes and is unsure if any of her partners have had the condition. The patient was recently started on antibiotics for a suspected urinary tract infection, but the discomfort may be related to the new lesion.        Review of Systems  Objective    BP (!) 120/92 (BP Location: Left Arm, Patient Position: Sitting, Cuff Size: Normal)   Pulse 68   Temp 98.2 F (36.8 C)   Ht 5\' 4"  (1.626 m)   Wt 146 lb (66.2 kg)   LMP 12/19/2023   SpO2 98%   BMI 25.06 kg/m  Physical Exam Vitals reviewed. Exam conducted with a chaperone present.  Constitutional:      General: She is not in acute distress.    Appearance: She is well-developed.  HENT:     Head: Normocephalic and atraumatic.  Eyes:     General: No scleral icterus.    Conjunctiva/sclera: Conjunctivae normal.  Cardiovascular:     Rate and Rhythm: Normal rate.  Pulmonary:     Effort: Pulmonary effort is normal. No respiratory distress.  Genitourinary:    Labia:        Right: Tenderness and lesion (ulceration with adjacent vesicle) present.      Comments: Vaginal mucosa pink, moist, normal rugae.  Nonfriable cervix without lesions, mild discharge, + bleeding noted on speculum exam. Skin:    General: Skin is warm and dry.     Findings: No rash.  Neurological:      Mental Status: She is alert and oriented to person, place, and time.  Psychiatric:        Behavior: Behavior normal.       No results found for any visits on 01/12/24.  Assessment & Plan     Problem List Items Addressed This Visit   None Visit Diagnoses       Vulvar lesion    -  Primary   Relevant Orders   Herpes simplex virus culture     Screen for STD (sexually transmitted disease)       Relevant Orders   Herpes simplex virus culture   Hepatitis B surface antigen   Hep B Core Ab W/Reflex   Hepatitis B surface antibody,qualitative   Hepatitis C antibody   HIV Antibody (routine testing w rflx)   RPR           Genital Herpes Presents with a new onset of a genital ulcer in the vaginal area, consistent with a herpes lesion. No known history of genital herpes and unsure about partners' status. Differential diagnosis includes other STIs, but clinical presentation is highly suggestive of HSV. Discussed the importance of early treatment with Valtrex to inhibit viral replication and reduce symptoms. - Swab the lesion for herpes testing - Perform  blood tests for hepatitis, HIV, and syphilis - Start Valtrex (valacyclovir) BID x7d - Continue antibiotics for suspected UTI pending herpes test results  Sexually Transmitted Infection (STI) Screening Given the genital ulcer and potential for other STIs, comprehensive screening is warranted. Swabs for gonorrhea, chlamydia, and trichomonas were sent yesterday. - Perform blood tests for hepatitis, HIV, and syphilis  Urinary Tract Infection (UTI) Initially treated for a UTI based on symptoms and presence of leukocytes in urine. Current symptoms may be related to the genital herpes lesion rather than a UTI. Discussed the possibility of stopping antibiotics if herpes is confirmed. - Reassess the need for antibiotics after herpes test results and UCx results        Meds ordered this encounter  Medications   valACYclovir (VALTREX)  1000 MG tablet    Sig: Take 1 tablet (1,000 mg total) by mouth 2 (two) times daily for 7 days.    Dispense:  14 tablet    Refill:  0     Return if symptoms worsen or fail to improve.      Shirlee Latch, MD  [facility] [phone] (phone) [phone] (fax)  [facility]

## 2024-01-13 ENCOUNTER — Telehealth: Payer: Self-pay

## 2024-01-13 LAB — CERVICOVAGINAL ANCILLARY ONLY
Bacterial Vaginitis (gardnerella): NEGATIVE
Candida Glabrata: NEGATIVE
Candida Vaginitis: POSITIVE — AB
Chlamydia: NEGATIVE
Comment: NEGATIVE
Comment: NEGATIVE
Comment: NEGATIVE
Comment: NEGATIVE
Comment: NEGATIVE
Comment: NORMAL
Neisseria Gonorrhea: NEGATIVE
Trichomonas: NEGATIVE

## 2024-01-13 LAB — HEPATITIS B SURFACE ANTIBODY,QUALITATIVE: Hep B Surface Ab, Qual: REACTIVE

## 2024-01-13 LAB — RPR: RPR Ser Ql: NONREACTIVE

## 2024-01-13 LAB — URINE CULTURE

## 2024-01-13 LAB — SPECIMEN STATUS REPORT

## 2024-01-13 LAB — HIV ANTIBODY (ROUTINE TESTING W REFLEX): HIV Screen 4th Generation wRfx: NONREACTIVE

## 2024-01-13 LAB — HEPATITIS B CORE AB W/REFLEX: Hep B Core Total Ab: NEGATIVE

## 2024-01-13 LAB — HEPATITIS B SURFACE ANTIGEN: Hepatitis B Surface Ag: NEGATIVE

## 2024-01-13 LAB — HEPATITIS C ANTIBODY: Hep C Virus Ab: NONREACTIVE

## 2024-01-13 NOTE — Telephone Encounter (Unsigned)
Copied from CRM [id number]. Topic: Appointment Scheduling - Scheduling Inquiry for Clinic >> Jan 12, 2024  8:14 AM Tiffany B wrote: Reason for CRM: Patient would like to transfer her care Dr. Beryle Flock, please advise.

## 2024-01-14 ENCOUNTER — Encounter: Payer: Self-pay | Admitting: Family Medicine

## 2024-01-14 ENCOUNTER — Other Ambulatory Visit: Payer: Self-pay

## 2024-01-14 MED ORDER — FLUCONAZOLE 150 MG PO TABS: 150.0000 mg | ORAL_TABLET | Freq: Once | ORAL | 0 refills | Status: AC

## 2024-01-14 NOTE — Telephone Encounter (Signed)
Pt advised and next appt moved to Dr. Penelope Coop schedule.

## 2024-01-14 NOTE — Telephone Encounter (Signed)
Ok to switch. I saw her twice recently and developed rapport. Can move next appt ot my schedule instead of Kellie's.

## 2024-01-15 LAB — HERPES SIMPLEX VIRUS CULTURE

## 2024-02-01 ENCOUNTER — Other Ambulatory Visit: Payer: Self-pay | Admitting: Family Medicine

## 2024-02-02 NOTE — Telephone Encounter (Signed)
Prescription was active 01/12/24 ended 01/19/24. Per OV notes pt is to make appt "if sx worsen or fail to improve."  Sent pt MyChart message to call and make appointment.  Requested Prescriptions  Pending Prescriptions Disp Refills   valACYclovir (VALTREX) 1000 MG tablet [Pharmacy Med Name: VALACYCLOVIR HCL 1 GRAM TABLET] 14 tablet 0    Sig: TAKE 1 TABLET (1,000 MG TOTAL) BY MOUTH TWICE A DAY FOR 7 DAYS     Antimicrobials:  Antiviral Agents - Anti-Herpetic Passed - 02/02/2024 10:43 AM      Passed - Valid encounter within last 12 months    Recent Outpatient Visits           3 weeks ago Vulvar lesion   [facility] Williams, Marzella Schlein, MD   3 weeks ago Cystitis with hematuria   [facility] Erasmo Downer, MD   11 months ago Chest pain, unspecified type   [facility] Alfredia Ferguson, PA-C   11 months ago Annual physical exam   [facility] Jacky Kindle, FNP   1 year ago Primary hypertension   [facility] Jacky Kindle, FNP       Future Appointments             In 1 month Bacigalupo, Marzella Schlein, MD [facility]

## 2024-02-04 ENCOUNTER — Other Ambulatory Visit: Payer: Self-pay | Admitting: Family Medicine

## 2024-02-04 DIAGNOSIS — N9089 Other specified noninflammatory disorders of vulva and perineum: Secondary | ICD-10-CM

## 2024-02-04 NOTE — Telephone Encounter (Signed)
Pt is calling in because she tried to request a refill on Valacyclovir and she received a message on MyChart from the nurse stating that she needed to make an appointment. Pt says she was told by Dr. B that if her symptoms continued or worsened to reach out to Dr. B. Pt says she doesn't need an appointment, she just wanted to have the medication on hand in case she did need it. Pt is requesting to speak with Dr. B regarding this matter. Please follow up with pt.

## 2024-02-05 MED ORDER — VALACYCLOVIR HCL 1 G PO TABS: 1000.0000 mg | ORAL_TABLET | Freq: Every day | ORAL | 0 refills | Status: AC

## 2024-02-05 MED ORDER — VALACYCLOVIR HCL 1 G PO TABS: 1000.0000 mg | ORAL_TABLET | Freq: Every day | ORAL | 0 refills | Status: DC

## 2024-02-05 NOTE — Telephone Encounter (Signed)
Patient reports that is a refill request to have on hand. No recurrence at the moment.Pharmacy is [facility] in [place]. Looks like the prescription was sent in to a different pharmacy.

## 2024-02-12 ENCOUNTER — Encounter: Payer: BC Managed Care – PPO | Admitting: Family Medicine

## 2024-02-13 DIAGNOSIS — Z713 Dietary counseling and surveillance: Secondary | ICD-10-CM | POA: Diagnosis not present

## 2024-02-13 DIAGNOSIS — Z13220 Encounter for screening for lipoid disorders: Secondary | ICD-10-CM | POA: Diagnosis not present

## 2024-02-13 DIAGNOSIS — Z131 Encounter for screening for diabetes mellitus: Secondary | ICD-10-CM | POA: Diagnosis not present

## 2024-02-13 DIAGNOSIS — Z136 Encounter for screening for cardiovascular disorders: Secondary | ICD-10-CM | POA: Diagnosis not present

## 2024-02-17 ENCOUNTER — Encounter: Payer: BC Managed Care – PPO | Admitting: Family Medicine

## 2024-03-03 ENCOUNTER — Ambulatory Visit (INDEPENDENT_AMBULATORY_CARE_PROVIDER_SITE_OTHER): Payer: Self-pay | Admitting: Family Medicine

## 2024-03-03 VITALS — BP 133/85 | HR 69 | Ht 64.0 in | Wt 142.0 lb

## 2024-03-03 DIAGNOSIS — Z23 Encounter for immunization: Secondary | ICD-10-CM | POA: Diagnosis not present

## 2024-03-03 DIAGNOSIS — Z1231 Encounter for screening mammogram for malignant neoplasm of breast: Secondary | ICD-10-CM | POA: Diagnosis not present

## 2024-03-03 DIAGNOSIS — I10 Essential (primary) hypertension: Secondary | ICD-10-CM

## 2024-03-03 DIAGNOSIS — Z114 Encounter for screening for human immunodeficiency virus [HIV]: Secondary | ICD-10-CM | POA: Diagnosis not present

## 2024-03-03 DIAGNOSIS — Z0000 Encounter for general adult medical examination without abnormal findings: Secondary | ICD-10-CM

## 2024-03-03 DIAGNOSIS — E7800 Pure hypercholesterolemia, unspecified: Secondary | ICD-10-CM | POA: Diagnosis not present

## 2024-03-03 MED ORDER — LOSARTAN POTASSIUM-HCTZ 50-12.5 MG PO TABS: 1.0000 | ORAL_TABLET | Freq: Every day | ORAL | 3 refills | Status: AC

## 2024-03-03 NOTE — Progress Notes (Signed)
 Complete physical exam   Patient: Brooke Koch   DOB: 1986/12/14   38 y.o. Female  MRN: [id number] Visit Date: 03/03/2024  Today's healthcare provider: Shirlee Latch, MD   Chief Complaint  Patient presents with   Annual Exam    Eating: General Sleeping: sleeping well Exercise: no exercise Feeling: good   Subjective    Brooke Koch is a 38 y.o. female who presents today for a complete physical exam.   Discussed the use of AI scribe software for clinical note transcription with the patient, who gave verbal consent to proceed.  History of Present Illness   The patient, with a history of hypertension, presents for a routine physical and wellness check-up. She expresses concern about her vaccination history, specifically inquiring about her last tetanus shot and whether she has received the HPV vaccine. She is unsure of her vaccination status and requests a copy of her vaccination records for her personal use. She also mentions that she has not received a flu shot this year. The patient also requests an HIV test for her peace of mind, despite not feeling sick. She is currently on losartan hydrochlorothiazide for her hypertension and reports no issues with this medication.        Last depression screening scores    03/03/2024    8:31 AM 01/11/2024    1:04 PM 08/26/2023    8:14 AM  PHQ 2/9 Scores  PHQ - 2 Score 0 0 0  PHQ- 9 Score 0 2    Last fall risk screening    01/11/2024    1:04 PM  Fall Risk   Falls in the past year? 0  Number falls in past yr: 0  Injury with Fall? 0        Medications: Outpatient Medications Prior to Visit  Medication Sig   Multiple Vitamin (MULTIVITAMIN) tablet Take 1 tablet by mouth daily.   Omega-3 Fatty Acids (FISH OIL PO) Take by mouth daily.   [DISCONTINUED] losartan-hydrochlorothiazide (HYZAAR) 50-12.5 MG tablet Take 1 tablet by mouth daily.   [DISCONTINUED] metroNIDAZOLE (FLAGYL) 500 MG tablet Take 500 mg by mouth 2 (two)  times daily.   No facility-administered medications prior to visit.    Review of Systems    Objective    BP 133/85   Pulse 69   Ht 5\' 4"  (1.626 m)   Wt 142 lb (64.4 kg)   SpO2 100%   BMI 24.37 kg/m    Physical Exam Vitals reviewed.  Constitutional:      General: She is not in acute distress.    Appearance: Normal appearance. She is well-developed. She is not diaphoretic.  HENT:     Head: Normocephalic and atraumatic.     Right Ear: Tympanic membrane, ear canal and external ear normal.     Left Ear: Tympanic membrane, ear canal and external ear normal.     Nose: Nose normal.     Mouth/Throat:     Mouth: Mucous membranes are moist.     Pharynx: Oropharynx is clear. No oropharyngeal exudate.  Eyes:     General: No scleral icterus.    Conjunctiva/sclera: Conjunctivae normal.     Pupils: Pupils are equal, round, and reactive to light.  Neck:     Thyroid: No thyromegaly.  Cardiovascular:     Rate and Rhythm: Normal rate and regular rhythm.     Heart sounds: Normal heart sounds. No murmur heard. Pulmonary:     Effort: Pulmonary effort is normal. No respiratory  distress.     Breath sounds: Normal breath sounds. No wheezing or rales.  Abdominal:     General: There is no distension.     Palpations: Abdomen is soft.     Tenderness: There is no abdominal tenderness.  Musculoskeletal:        General: No deformity.     Cervical back: Neck supple.     Right lower leg: No edema.     Left lower leg: No edema.  Lymphadenopathy:     Cervical: No cervical adenopathy.  Skin:    General: Skin is warm and dry.     Findings: No rash.  Neurological:     Mental Status: She is alert and oriented to person, place, and time. Mental status is at baseline.     Gait: Gait normal.  Psychiatric:        Mood and Affect: Mood normal.        Behavior: Behavior normal.        Thought Content: Thought content normal.      No results found for any visits on 03/03/24.  Assessment & Plan     Routine Health Maintenance and Physical Exam  Exercise Activities and Dietary recommendations  Goals   None     Immunization History  Administered Date(s) Administered   DTaP 08/04/1985, 09/28/1985, 12/10/1985, 03/18/1986, 07/15/1990   HIB (PRP-OMP) 06/20/1986   Hepatitis B, PED/ADOLESCENT 09/28/1995, 10/26/1995, 04/04/1996   MMR 09/08/1986, 07/15/1990   Moderna Sars-Covid-2 Vaccination 01/02/2021, 01/30/2021   Polio, Unspecified 08/04/1985, 09/28/1985, 03/18/1986, 07/15/1990   Td 12/24/2004, 03/03/2024   Tdap 03/14/2011    Health Maintenance  Topic Date Due   COVID-19 Vaccine (3 - 2024-25 season) 08/23/2023   INFLUENZA VACCINE  03/21/2024 (Originally 07/23/2023)   Cervical Cancer Screening (HPV/Pap Cotest)  08/25/2028   DTaP/Tdap/Td (8 - Td or Tdap) 03/03/2034   Hepatitis C Screening  Completed   HIV Screening  Completed   HPV VACCINES  Aged Out    Discussed health benefits of physical activity, and encouraged her to engage in regular exercise appropriate for her age and condition.  Problem List Items Addressed This Visit       Cardiovascular and Mediastinum   Primary hypertension   Blood pressure is well-controlled on current medication regimen of losartan hydrochlorothiazide 50/12.5 mg once daily. Prescription renewals are automatic. - Continue losartan hydrochlorothiazide 50/12.5 mg once daily      Relevant Medications   losartan-hydrochlorothiazide (HYZAAR) 50-12.5 MG tablet   Other Relevant Orders   Comprehensive metabolic panel   Lipid panel   CBC w/Diff/Platelet     Other   Elevated LDL cholesterol level   Relevant Orders   Comprehensive metabolic panel   Lipid panel   CBC w/Diff/Platelet   Other Visit Diagnoses       Encounter for annual physical exam    -  Primary   Relevant Orders   Comprehensive metabolic panel   Lipid panel   CBC w/Diff/Platelet   HIV antibody (with reflex)     Breast cancer screening by mammogram       Relevant Orders    MM 3D SCREENING MAMMOGRAM BILATERAL BREAST     Encounter for screening for HIV       Relevant Orders   HIV antibody (with reflex)     Need for Td vaccine       Relevant Orders   Td vaccine greater than or equal to 7yo preservative free IM (Completed)     Immunization due  Relevant Orders   Td vaccine greater than or equal to 7yo preservative free IM (Completed)           HIV Screening She requests repeat HIV screening for reassurance following previous STD screening in January. No symptoms reported, but she wants to ensure she is not in the window period. - Order HIV test  General Health Maintenance Routine physical examination focused on wellness screenings and vaccinations. She is due for a tetanus booster if more than 10 years since last dose. HPV vaccine is recommended for prevention of cervical cancer and can clear existing HPV infection. Insurance coverage for HPV vaccine is variable for ages 58-45. Mammogram to be scheduled after 40th birthday. - Check state database for vaccination records - Provide copy of vaccination records if available - Discuss HPV vaccination benefits, including prevention of cervical cancer and potential to clear HPV infection - Discuss insurance coverage for HPV vaccine for ages 73-45 - Order routine lab tests including kidney and liver function, cholesterol, blood counts, and blood sugar - Order mammogram to be scheduled after 38th birthday - Provide contact information for [facility] for mammogram scheduling - Discuss tetanus vaccination and recommend if more than 10 years since last dose - Offer option to receive tetanus or HPV vaccine today or schedule for later - Schedule follow-up appointment in 6 months for blood pressure check       Return in about 6 months (around 09/03/2024) for chronic disease f/u.     Shirlee Latch, MD  [facility] [phone] (phone) [phone] (fax)  [facility]

## 2024-03-03 NOTE — Assessment & Plan Note (Signed)
 Blood pressure is well-controlled on current medication regimen of losartan hydrochlorothiazide 50/12.5 mg once daily. Prescription renewals are automatic. - Continue losartan hydrochlorothiazide 50/12.5 mg once daily

## 2024-03-03 NOTE — Patient Instructions (Signed)
 Call [facility] to schedule a mammogram [phone]

## 2024-03-04 LAB — COMPREHENSIVE METABOLIC PANEL
ALT: 11 IU/L (ref 0–32)
AST: 17 IU/L (ref 0–40)
Albumin: 4.8 g/dL (ref 3.9–4.9)
Alkaline Phosphatase: 44 IU/L (ref 44–121)
BUN/Creatinine Ratio: 17 (ref 9–23)
BUN: 11 mg/dL (ref 6–20)
Bilirubin Total: 0.4 mg/dL (ref 0.0–1.2)
CO2: 28 mmol/L (ref 20–29)
Calcium: 10 mg/dL (ref 8.7–10.2)
Chloride: 100 mmol/L (ref 96–106)
Creatinine, Ser: 0.66 mg/dL (ref 0.57–1.00)
Globulin, Total: 2.3 g/dL (ref 1.5–4.5)
Glucose: 86 mg/dL (ref 70–99)
Potassium: 4.3 mmol/L (ref 3.5–5.2)
Sodium: 141 mmol/L (ref 134–144)
Total Protein: 7.1 g/dL (ref 6.0–8.5)
eGFR: 114 mL/min/{1.73_m2} (ref >59)

## 2024-03-04 LAB — LIPID PANEL
Chol/HDL Ratio: 4.4 ratio (ref 0.0–4.4)
Cholesterol, Total: 222 mg/dL — ABNORMAL HIGH (ref 100–199)
HDL: 51 mg/dL (ref >39)
LDL Chol Calc (NIH): 154 mg/dL — ABNORMAL HIGH (ref 0–99)
Triglycerides: 93 mg/dL (ref 0–149)
VLDL Cholesterol Cal: 17 mg/dL (ref 5–40)

## 2024-03-04 LAB — CBC WITH DIFFERENTIAL/PLATELET
Basophils Absolute: 0 10*3/uL (ref 0.0–0.2)
Basos: 1 %
EOS (ABSOLUTE): 0 10*3/uL (ref 0.0–0.4)
Eos: 0 %
Hematocrit: 43.2 % (ref 34.0–46.6)
Hemoglobin: 14.3 g/dL (ref 11.1–15.9)
Immature Grans (Abs): 0 10*3/uL (ref 0.0–0.1)
Immature Granulocytes: 0 %
Lymphocytes Absolute: 1.4 10*3/uL (ref 0.7–3.1)
Lymphs: 21 %
MCH: 28.2 pg (ref 26.6–33.0)
MCHC: 33.1 g/dL (ref 31.5–35.7)
MCV: 85 fL (ref 79–97)
Monocytes Absolute: 0.6 10*3/uL (ref 0.1–0.9)
Monocytes: 10 %
Neutrophils Absolute: 4.4 10*3/uL (ref 1.4–7.0)
Neutrophils: 68 %
Platelets: 358 10*3/uL (ref 150–450)
RBC: 5.07 x10E6/uL (ref 3.77–5.28)
RDW: 13.3 % (ref 11.7–15.4)
WBC: 6.5 10*3/uL (ref 3.4–10.8)

## 2024-03-04 LAB — HIV ANTIBODY (ROUTINE TESTING W REFLEX): HIV Screen 4th Generation wRfx: NONREACTIVE

## 2024-03-07 ENCOUNTER — Encounter: Payer: Self-pay | Admitting: Obstetrics

## 2024-03-07 ENCOUNTER — Ambulatory Visit (INDEPENDENT_AMBULATORY_CARE_PROVIDER_SITE_OTHER): Admitting: Obstetrics

## 2024-03-07 VITALS — BP 119/74 | HR 90 | Ht 64.0 in | Wt 136.0 lb

## 2024-03-07 DIAGNOSIS — L309 Dermatitis, unspecified: Secondary | ICD-10-CM | POA: Insufficient documentation

## 2024-03-07 DIAGNOSIS — A6004 Herpesviral vulvovaginitis: Secondary | ICD-10-CM | POA: Diagnosis not present

## 2024-03-07 MED ORDER — CLOTRIMAZOLE 1 % EX OINT: 1.0000 | TOPICAL_OINTMENT | Freq: Two times a day (BID) | CUTANEOUS | 0 refills | Status: DC

## 2024-03-07 MED ORDER — TRIAMCINOLONE ACETONIDE 0.025 % EX OINT: 1.0000 | TOPICAL_OINTMENT | Freq: Two times a day (BID) | CUTANEOUS | 0 refills | Status: DC

## 2024-03-07 MED ORDER — VALACYCLOVIR HCL 1 G PO TABS: 1000.0000 mg | ORAL_TABLET | ORAL | 0 refills | Status: DC

## 2024-03-07 NOTE — Progress Notes (Signed)
 GYNECOLOGY PROGRESS NOTE  Subjective:  PCP: Erasmo Downer, MD  Patient ID: Brooke Koch, female    DOB: [birth year], 38 y.o.   MRN: [id number]  HPI  Patient is a 38 y.o. G65P1001 female who presents for a vaginal rash, she got a wax last Thursday and afterward, starting having external itching. Noticed some redness on her L labia, and by yesterday, it have traveled to both sides. Gets waxes frequently and has never had this happen before. 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 Is very itchy, denies pain, hasn't taken or put anything on the rash. Was not given any medication for future HSV outbreaks and is concerned about this for future.   Period Cycle (Days): 30 Period Duration (Days): 4-5 Period Pattern: Regular Menstrual Flow: Moderate Menstrual Control: Tampon Dysmenorrhea: (!) Mild Dysmenorrhea Symptoms: Cramping  OB History     Gravida  1   Para  1   Term  1   Preterm      AB      Living  1      SAB      IAB      Ectopic      Multiple      Live Births  1          Past Medical History:  Diagnosis Date   Gastritis    History of Helicobacter pylori infection    HSV (herpes simplex virus) infection    Wears contact lenses    Past Surgical History:  Procedure Laterality Date   COLONOSCOPY WITH PROPOFOL N/A 10/29/2020   Procedure: COLONOSCOPY WITH BIOPSY;  Surgeon: Midge Minium, MD;  Location: [facility];  Service: Endoscopy;  Laterality: N/A;   ESOPHAGOGASTRODUODENOSCOPY  12/2009   Family History  Problem Relation Age of Onset   Hypertension Mother    Glaucoma Mother    Hyperlipidemia Mother    Hypertension Father    Diabetes Father    Prostate cancer Father        cancer free x 1 year   Breast cancer Cousin    Breast cancer Cousin    Social History   Socioeconomic History   Marital status: Single    Spouse name: Not on file   Number of children: Not on file   Years of education: Not on  file   Highest education level: Associate degree: academic program  Occupational History   Not on file  Tobacco Use   Smoking status: Never   Smokeless tobacco: Never  Vaping Use   Vaping status: Never Used  Substance and Sexual Activity   Alcohol use: Yes    Alcohol/week: 0.0 standard drinks of alcohol    Comment: social drinker   Drug use: No   Sexual activity: Yes  Other Topics Concern   Not on file  Social History Narrative   Not on file   Social Drivers of Health   Financial Resource Strain: Low Risk  (03/03/2024)   Overall Financial Resource Strain (CARDIA)    Difficulty of Paying Living Expenses: Not very hard  Food Insecurity: No Food Insecurity (03/03/2024)   Hunger Vital Sign    Worried About Running Out of Food in the Last Year: Never true    Ran Out of Food in the Last Year: Never true  Transportation Needs: No Transportation Needs (03/03/2024)   PRAPARE - Administrator, Civil Service (Medical): No    Lack of Transportation (Non-Medical): No  Physical Activity: Unknown (03/03/2024)   Exercise Vital Sign    Days of Exercise per Week: 0 days    Minutes of Exercise per Session: Not on file  Stress: No Stress Concern Present (03/03/2024)   [employer] of Occupational Health - Occupational Stress Questionnaire    Feeling of Stress : Only a little  Social Connections: Socially Isolated (03/03/2024)   Social Connection and Isolation Panel [NHANES]    Frequency of Communication with Friends and Family: More than three times a week    Frequency of Social Gatherings with Friends and Family: Three times a week    Attends Religious Services: Never    Active Member of Clubs or Organizations: No    Attends Engineer, structural: Not on file    Marital Status: Never married  Intimate Partner Violence: Not on file   Current Outpatient Medications on File Prior to Visit  Medication Sig Dispense Refill   losartan-hydrochlorothiazide (HYZAAR) 50-12.5  MG tablet Take 1 tablet by mouth daily. 90 tablet 3   Multiple Vitamin (MULTIVITAMIN) tablet Take 1 tablet by mouth daily.     Omega-3 Fatty Acids (FISH OIL PO) Take by mouth daily.     No current facility-administered medications on file prior to visit.   Allergies  Allergen Reactions   Septra [Sulfamethoxazole-Trimethoprim] Hives   Review of Systems Pertinent items are noted in HPI.   Objective:   Blood pressure 119/74, pulse 90, height 5\' 4"  (1.626 m), weight 136 lb (61.7 kg), last menstrual period 02/29/2024. Body mass index is 23.34 kg/m. Physical Exam Vitals and nursing note reviewed. Exam conducted with a chaperone present.  Constitutional:      Appearance: Normal appearance.  HENT:     Head: Normocephalic and atraumatic.  Eyes:     Extraocular Movements: Extraocular movements intact.  Pulmonary:     Effort: Pulmonary effort is normal.  Abdominal:     General: Abdomen is flat.     Palpations: Abdomen is soft.  Genitourinary:    Pubic Area: No rash.      Labia:        Right: Rash present. No tenderness or lesion.        Left: Rash present. No tenderness or lesion.        Comments: Bilateral labia with mild edema and diffuse erythema.  Neurological:     General: No focal deficit present.     Mental Status: She is alert.  Psychiatric:        Mood and Affect: Mood normal.       Assessment/Plan:   1. 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 G1P1001 with 4d of vulvar dermatitis after waxing; also with recently dx HSV-2, primary outbreak in Jan '25. Rash today does not appear herpetic.  -Rx for Triamcinolone and Clotrimazole, apply both BID x 2 weeks -Notify clinic if no improvement, or nature of rash changes. -Rx for Valtrex, episodic treatment course. Discussed suppression and when we would consider starting. Notify clinic if 3+ outbreaks from Jan - June.   Follow up prn   Julieanne Manson, DO [place]  [facility]

## 2024-03-14 ENCOUNTER — Telehealth: Payer: Self-pay | Admitting: Family Medicine

## 2024-03-14 DIAGNOSIS — Z113 Encounter for screening for infections with a predominantly sexual mode of transmission: Secondary | ICD-10-CM | POA: Diagnosis not present

## 2024-03-15 ENCOUNTER — Ambulatory Visit

## 2024-03-17 NOTE — Telephone Encounter (Signed)
LVM for patient to call back to the clinic.

## 2024-03-18 ENCOUNTER — Encounter: Payer: Self-pay | Admitting: Family Medicine

## 2024-04-07 ENCOUNTER — Ambulatory Visit (INDEPENDENT_AMBULATORY_CARE_PROVIDER_SITE_OTHER): Admitting: Physician Assistant

## 2024-04-07 DIAGNOSIS — Z23 Encounter for immunization: Secondary | ICD-10-CM | POA: Diagnosis not present

## 2024-04-08 NOTE — Addendum Note (Signed)
 Addended by: Euna Armon on: 04/08/2024 07:01 AM   Modules accepted: Level of Service

## 2024-04-08 NOTE — Progress Notes (Addendum)
 HPV 9-valent/first shot was administered by CMA, Monica Alston.

## 2024-05-25 ENCOUNTER — Ambulatory Visit: Payer: Self-pay

## 2024-05-25 NOTE — Telephone Encounter (Signed)
 FYI Only or Action Required?: FYI only for provider  Patient was last seen in primary care on 03/03/2024 by Mazie Speed, MD. Called Nurse Triage reporting Vaginitis. Symptoms began several days ago. Interventions attempted: Nothing. Symptoms are: gradually worsening.  Triage Disposition: - No appt available. Referred to UC.   Patient/caregiver understands and will follow disposition?: -Yes       Reason for Disposition  MODERATE-SEVERE itching (i.e., interferes with school, work, or sleep)  Answer Assessment - Initial Assessment Questions 1. SYMPTOM: "What's the main symptom you're concerned about?" (e.g., pain, itching, dryness)     ---  Urinary Frequency, malodorous urine, Vaginal itching, Vaginal discharge ( white and thick), redness around labia majora.     2. LOCATION: "Where is the  located?" (e.g., inside/outside, left/right)     ---Itching- Vaginal  Introtus    3. ONSET: "When did the  start?"     ------- Monday 06/02   4. PAIN: "Is there any pain?" If Yes, ask: "How bad is it?" (Scale: 1-10; mild, moderate, severe)   -  MILD (1-3): Doesn't interfere with normal activities.    -  MODERATE (4-7): Interferes with normal activities (e.g., work or school) or awakens from sleep.     -  SEVERE (8-10): Excruciating pain, unable to do any normal activities.       -------------------  [facility] ( attributes this to her menstrual cycle coming up soon-1 wk)    5. ITCHING: "Is there any itching?" If Yes, ask: "How bad is it?" (Scale: 1-10; mild, moderate, severe)     -------------3-4/ 10    6. CAUSE: "What do you think is causing the discharge?" "Have you had the same problem before? What happened then?"    ------------Unsure    7. OTHER SYMPTOMS: "Do you have any other symptoms?" (e.g., fever, itching, vaginal bleeding, pain with urination, injury to genital area, vaginal foreign body)     ----- See above    8. PREGNANCY: "Is there any chance you are  pregnant?" "When was your last menstrual period?"     ------------ Denies  Protocols used: Vaginal Symptoms-A-AH

## 2024-05-26 ENCOUNTER — Ambulatory Visit
Admission: RE | Admit: 2024-05-26 | Discharge: 2024-05-26 | Disposition: A | Discharge status: Home / Self Care | Source: Ambulatory Visit | Attending: Family Medicine | Admitting: Family Medicine

## 2024-05-26 VITALS — BP 115/63 | HR 65 | Temp 98.6°F | Resp 16

## 2024-05-26 DIAGNOSIS — N898 Other specified noninflammatory disorders of vagina: Secondary | ICD-10-CM | POA: Insufficient documentation

## 2024-05-26 DIAGNOSIS — R350 Frequency of micturition: Secondary | ICD-10-CM | POA: Diagnosis not present

## 2024-05-26 DIAGNOSIS — Z202 Contact with and (suspected) exposure to infections with a predominantly sexual mode of transmission: Secondary | ICD-10-CM | POA: Diagnosis not present

## 2024-05-26 LAB — URINALYSIS, W/ REFLEX TO CULTURE (INFECTION SUSPECTED)
Bilirubin Urine: NEGATIVE
Glucose, UA: NEGATIVE mg/dL
Ketones, ur: NEGATIVE mg/dL
Leukocytes,Ua: NEGATIVE
Nitrite: NEGATIVE
Protein, ur: NEGATIVE mg/dL
Specific Gravity, Urine: 1.02 (ref 1.005–1.030)
pH: 6 (ref 5.0–8.0)

## 2024-05-26 LAB — HIV ANTIBODY (ROUTINE TESTING W REFLEX): HIV Screen 4th Generation wRfx: NONREACTIVE

## 2024-05-26 NOTE — ED Triage Notes (Signed)
 Sx since Monday  Urinary frequency  Odor to urine  Patient took diflucan  this morning  for discharge and it has stopped.

## 2024-05-26 NOTE — Discharge Instructions (Addendum)
 Your vaginal swab test results will be available in the next 72 hours. I sent your urine for culture.  If positive, someone will contact you.  You should see your results in your MyChart account.

## 2024-05-26 NOTE — ED Provider Notes (Signed)
 [facility]    CSN: [id number] Arrival date & time: 05/26/24  1805      History   Chief Complaint Chief Complaint  Patient presents with   Urinary Frequency    Possible UTI and yeast infection - Entered by patient     HPI HPI Brooke Koch is a 38 y.o. female.    Brooke Koch presents for urinary frequency, vaginal discharge with urinary odor that started on Monday .  Tried Fluconazole  yesterday.  Has not had any antibiotics in last 30 days.   Denies known STI exposure.  Lavoris does not use condoms regularly. She is  not currently pregnant.  Patient's last menstrual period was 05/11/2024 (approximate).    - Abnormal vaginal discharge: thick and white  - vaginal / urinary odor: trash - vaginal bleeding: no - Dysuria: no - Hematuria: no - Urinary urgency:no  - Urinary frequency: yes  - Fever: no - Abdominal pain: lower  - Pelvic pain: no - Rash/Skin lesions/mouth ulcers: no - Nausea: no  - Vomiting: no  - Back Pain: no        Past Medical History:  Diagnosis Date   Gastritis    History of Helicobacter pylori infection    HSV (herpes simplex virus) infection    Wears contact lenses     Patient Active Problem List   Diagnosis Date Noted   Type 2 HSV infection of vulvovaginal region 03/07/2024   Vulvar dermatitis 03/07/2024   History of migraine 02/06/2022   Chronic migraine without aura without status migrainosus, not intractable 02/06/2022   Menorrhagia with irregular cycle 02/06/2022   Elevated LDL cholesterol level 02/06/2022   Primary hypertension 07/23/2021   History of recurrent UTIs 06/16/2017    Past Surgical History:  Procedure Laterality Date   COLONOSCOPY WITH PROPOFOL  N/A 10/29/2020   Procedure: COLONOSCOPY WITH BIOPSY;  Surgeon: Jinny Carmine, MD;  Location: [facility];  Service: Endoscopy;  Laterality: N/A;   ESOPHAGOGASTRODUODENOSCOPY  12/2009    OB History     Gravida  1   Para  1   Term  1    Preterm      AB      Living  1      SAB      IAB      Ectopic      Multiple      Live Births  1            Home Medications    Prior to Admission medications   Medication Sig Start Date End Date Taking? Authorizing Provider  losartan -hydrochlorothiazide (HYZAAR) 50-12.5 MG tablet Take 1 tablet by mouth daily. 03/03/24  Yes Bacigalupo, Jon HERO, MD  Clotrimazole  1 % OINT Apply 1 Application topically 2 (two) times daily. Apply pea-sized amount to affected tissues and rub in. Layer with other topical. 03/07/24   Leigh Sober, MD  Multiple Vitamin (MULTIVITAMIN) tablet Take 1 tablet by mouth daily.    [provider]  Omega-3 Fatty Acids (FISH OIL PO) Take by mouth daily.    [provider]  triamcinolone  (KENALOG ) 0.025 % ointment Apply 1 Application topically 2 (two) times daily. Apply pea-sized amount to affected tissues and rub in. Layer with other topical. 03/07/24   Leigh Sober, MD  valACYclovir  (VALTREX ) 1000 MG tablet Take 1 tablet (1,000 mg total) by mouth as directed. Take 1 tablet by mouth daily for 5 days. Take at first symptoms of new onset. No benefit to taking more than 5  days. 03/07/24   Leigh Sober, MD    Family History Family History  Problem Relation Age of Onset   Hypertension Mother    Glaucoma Mother    Hyperlipidemia Mother    Hypertension Father    Diabetes Father    Prostate cancer Father        cancer free x 1 year   Breast cancer Cousin    Breast cancer Cousin     Social History Social History   Tobacco Use   Smoking status: Never   Smokeless tobacco: Never  Vaping Use   Vaping status: Never Used  Substance Use Topics   Alcohol use: Yes    Alcohol/week: 0.0 standard drinks of alcohol    Comment: social drinker   Drug use: No     Allergies   Septra [sulfamethoxazole-trimethoprim]   Review of Systems Review of Systems: :negative unless otherwise stated in HPI.      Physical Exam Triage Vital Signs ED  Triage Vitals  Encounter Vitals Group     BP 05/26/24 1817 115/63     Systolic BP Percentile --      Diastolic BP Percentile --      Pulse Rate 05/26/24 1817 65     Resp 05/26/24 1817 16     Temp 05/26/24 1817 98.6 F (37 C)     Temp Source 05/26/24 1817 Oral     SpO2 05/26/24 1817 100 %     Weight --      Height --      Head Circumference --      Peak Flow --      Pain Score 05/26/24 1816 0     Pain Loc --      Pain Education --      Exclude from Growth Chart --    No data found.  Updated Vital Signs BP 115/63 (BP Location: Left Arm)   Pulse 65   Temp 98.6 F (37 C) (Oral)   Resp 16   LMP 05/11/2024 (Approximate)   SpO2 100%   Visual Acuity Right Eye Distance:   Left Eye Distance:   Bilateral Distance:    Right Eye Near:   Left Eye Near:    Bilateral Near:     Physical Exam GEN: well appearing female in no acute distress  CVS: well perfused  RESP: speaking in full sentences without pause  GU: deferred, patient performed self swab    UC Treatments / Results  Labs (all labs ordered are listed, but only abnormal results are displayed) Labs Reviewed  URINE CULTURE   URINALYSIS, W/ REFLEX TO CULTURE (INFECTION SUSPECTED) - Abnormal; Notable for the following components:   Hgb urine dipstick SMALL (*)    Bacteria, UA FEW (*)    All other components within normal limits  CERVICOVAGINAL ANCILLARY ONLY   HIV ANTIBODY (ROUTINE TESTING W REFLEX)  RPR    EKG   Radiology No results found.  Procedures Procedures (including critical care time)  Medications Ordered in UC Medications - No data to display  Initial Impression / Assessment and Plan / UC Course  I have reviewed the triage vital signs and the nursing notes.  Pertinent labs & imaging results that were available during my care of the patient were reviewed by me and considered in my medical decision making (see chart for details).      Patient is a 38 y.o.Brooke Koch female  who presents for .  Overall  patient is well-appearing and afebrile.  Vital  signs stable.  Urinalysis not consistent with acute cystitis.   Hematuria present on urinalysis is supported on microscopy.  Defer treatment for UTI at this time.  Urine culture obtained.  Follow-up sensitivities and start antibiotics, if needed.  Vaginal swab for yeast vaginitis and bacterial vaginitis, trichomonas, gonorrhea and chlamydia obtained.  HIV and RPR also collected.   Return precautions including abdominal pain, fever, chills, nausea, or vomiting given. Discussed MDM, treatment plan and plan for follow-up with patient who agrees with plan.       Final Clinical Impressions(s) / UC Diagnoses   Final diagnoses:  Urinary frequency  Vaginal discharge  Possible exposure to STD     Discharge Instructions       Your vaginal swab test results will be available in the next 72 hours. I sent your urine for culture.  If positive, someone will contact you.  You should see your results in your MyChart account.       ED Prescriptions   None    PDMP not reviewed this encounter.   Santos Hardwick, DO 06/11/24 2014

## 2024-05-27 LAB — URINE CULTURE
Culture: 10000 — AB
Special Requests: NORMAL

## 2024-05-27 LAB — RPR: RPR Ser Ql: NONREACTIVE

## 2024-05-27 LAB — CERVICOVAGINAL ANCILLARY ONLY
Bacterial Vaginitis (gardnerella): NEGATIVE
Candida Glabrata: NEGATIVE
Candida Vaginitis: POSITIVE — AB
Chlamydia: NEGATIVE
Comment: NEGATIVE
Comment: NEGATIVE
Comment: NEGATIVE
Comment: NEGATIVE
Comment: NEGATIVE
Comment: NORMAL
Neisseria Gonorrhea: NEGATIVE
Trichomonas: NEGATIVE

## 2024-05-30 ENCOUNTER — Ambulatory Visit (HOSPITAL_COMMUNITY): Payer: Self-pay

## 2024-05-30 MED ORDER — FLUCONAZOLE 150 MG PO TABS: 150.0000 mg | ORAL_TABLET | Freq: Once | ORAL | 0 refills | Status: AC

## 2024-06-14 ENCOUNTER — Ambulatory Visit
Admission: RE | Admit: 2024-06-14 | Discharge: 2024-06-14 | Disposition: A | Discharge status: Home / Self Care | Source: Ambulatory Visit | Attending: Family Medicine | Admitting: Family Medicine

## 2024-06-14 ENCOUNTER — Ambulatory Visit

## 2024-06-14 DIAGNOSIS — Z1231 Encounter for screening mammogram for malignant neoplasm of breast: Secondary | ICD-10-CM | POA: Insufficient documentation

## 2024-06-15 ENCOUNTER — Other Ambulatory Visit (HOSPITAL_COMMUNITY)
Admission: RE | Admit: 2024-06-15 | Discharge: 2024-06-15 | Disposition: A | Discharge status: Home / Self Care | Source: Ambulatory Visit | Attending: Certified Nurse Midwife | Admitting: Certified Nurse Midwife

## 2024-06-15 ENCOUNTER — Ambulatory Visit (INDEPENDENT_AMBULATORY_CARE_PROVIDER_SITE_OTHER): Admitting: Certified Nurse Midwife

## 2024-06-15 ENCOUNTER — Encounter: Payer: Self-pay | Admitting: Certified Nurse Midwife

## 2024-06-15 VITALS — BP 127/85 | HR 76 | Wt 140.6 lb

## 2024-06-15 DIAGNOSIS — A6004 Herpesviral vulvovaginitis: Secondary | ICD-10-CM | POA: Diagnosis not present

## 2024-06-15 DIAGNOSIS — Z113 Encounter for screening for infections with a predominantly sexual mode of transmission: Secondary | ICD-10-CM

## 2024-06-15 DIAGNOSIS — N926 Irregular menstruation, unspecified: Secondary | ICD-10-CM | POA: Diagnosis not present

## 2024-06-15 DIAGNOSIS — Z3202 Encounter for pregnancy test, result negative: Secondary | ICD-10-CM | POA: Diagnosis not present

## 2024-06-15 DIAGNOSIS — N898 Other specified noninflammatory disorders of vagina: Secondary | ICD-10-CM | POA: Insufficient documentation

## 2024-06-15 LAB — POCT URINE PREGNANCY: Preg Test, Ur: NEGATIVE

## 2024-06-15 MED ORDER — VALACYCLOVIR HCL 1 G PO TABS: 1000.0000 mg | ORAL_TABLET | ORAL | 4 refills | Status: AC

## 2024-06-15 NOTE — Addendum Note (Signed)
 Addended by: JAYNE RAISIN on: 06/15/2024 12:20 PM   Modules accepted: Orders

## 2024-06-15 NOTE — Progress Notes (Addendum)
 Myrla Jon HERO, MD   Chief Complaint  Patient presents with   Vaginitis    HPI:      Brooke Koch is a 38 y.o. G1P1001 whose LMP was Patient's last menstrual period was 05/30/2024 (approximate)., presents today for vaginal discharge. She recently returned from vacation and is concerned that wet swimsuit may have caused infection. She denies new sexual contacts, condoms for contraception. Also with irregular bleeding, cycle this month was normal but now having bloody discharge and cramping. Had irritation with blisters at beginning of this month, took valtrex  as prescribed but unsure if it was an HSV outbreak or irritation from waxing.    Patient Active Problem List   Diagnosis Date Noted   Type 2 HSV infection of vulvovaginal region 03/07/2024   Vulvar dermatitis 03/07/2024   History of migraine 02/06/2022   Chronic migraine without aura without status migrainosus, not intractable 02/06/2022   Menorrhagia with irregular cycle 02/06/2022   Elevated LDL cholesterol level 02/06/2022   Primary hypertension 07/23/2021   History of recurrent UTIs 06/16/2017    Past Surgical History:  Procedure Laterality Date   COLONOSCOPY WITH PROPOFOL  N/A 10/29/2020   Procedure: COLONOSCOPY WITH BIOPSY;  Surgeon: Jinny Carmine, MD;  Location: [facility];  Service: Endoscopy;  Laterality: N/A;   ESOPHAGOGASTRODUODENOSCOPY  12/2009    Family History  Problem Relation Age of Onset   Hypertension Mother    Glaucoma Mother    Hyperlipidemia Mother    Hypertension Father    Diabetes Father    Prostate cancer Father        cancer free x 1 year   Breast cancer Cousin    Breast cancer Cousin     Social History   Socioeconomic History   Marital status: Single    Spouse name: Not on file   Number of children: Not on file   Years of education: Not on file   Highest education level: Associate degree: academic program  Occupational History   Not on file  Tobacco Use    Smoking status: Never   Smokeless tobacco: Never  Vaping Use   Vaping status: Never Used  Substance and Sexual Activity   Alcohol use: Yes    Alcohol/week: 0.0 standard drinks of alcohol    Comment: social drinker   Drug use: No   Sexual activity: Yes  Other Topics Concern   Not on file  Social History Narrative   Not on file   Social Drivers of Health   Financial Resource Strain: Low Risk  (03/03/2024)   Overall Financial Resource Strain (CARDIA)    Difficulty of Paying Living Expenses: Not very hard  Food Insecurity: No Food Insecurity (03/03/2024)   Hunger Vital Sign    Worried About Running Out of Food in the Last Year: Never true    Ran Out of Food in the Last Year: Never true  Transportation Needs: No Transportation Needs (03/03/2024)   PRAPARE - Administrator, Civil Service (Medical): No    Lack of Transportation (Non-Medical): No  Physical Activity: Unknown (03/03/2024)   Exercise Vital Sign    Days of Exercise per Week: 0 days    Minutes of Exercise per Session: Not on file  Stress: No Stress Concern Present (03/03/2024)   [employer] of Occupational Health - Occupational Stress Questionnaire    Feeling of Stress : Only a little  Social Connections: Socially Isolated (03/03/2024)   Social Connection and Isolation Panel    Frequency  of Communication with Friends and Family: More than three times a week    Frequency of Social Gatherings with Friends and Family: Three times a week    Attends Religious Services: Never    Active Member of Clubs or Organizations: No    Attends Engineer, structural: Not on file    Marital Status: Never married  Intimate Partner Violence: Not on file    Outpatient Medications Prior to Visit  Medication Sig Dispense Refill   losartan -hydrochlorothiazide (HYZAAR) 50-12.5 MG tablet Take 1 tablet by mouth daily. 90 tablet 3   Multiple Vitamin (MULTIVITAMIN) tablet Take 1 tablet by mouth daily.     Omega-3 Fatty  Acids (FISH OIL PO) Take by mouth daily.     Clotrimazole  1 % OINT Apply 1 Application topically 2 (two) times daily. Apply pea-sized amount to affected tissues and rub in. Layer with other topical. 30 g 0   triamcinolone  (KENALOG ) 0.025 % ointment Apply 1 Application topically 2 (two) times daily. Apply pea-sized amount to affected tissues and rub in. Layer with other topical. 30 g 0   valACYclovir  (VALTREX ) 1000 MG tablet Take 1 tablet (1,000 mg total) by mouth as directed. Take 1 tablet by mouth daily for 5 days. Take at first symptoms of new onset. No benefit to taking more than 5 days. 30 tablet 0   No facility-administered medications prior to visit.      ROS:  Review of Systems  Constitutional: Negative.   Respiratory: Negative.    Cardiovascular: Negative.   Genitourinary:  Positive for menstrual problem, pelvic pain, vaginal bleeding and vaginal discharge.     OBJECTIVE:   Vitals:  BP 127/85   Pulse 76   Wt 140 lb 9.6 oz (63.8 kg)   LMP 05/30/2024 (Approximate)   BMI 24.13 kg/m   Physical Exam Vitals reviewed.  Constitutional:      General: She is not in acute distress.    Appearance: Normal appearance.   Cardiovascular:     Rate and Rhythm: Normal rate.  Pulmonary:     Effort: Pulmonary effort is normal.  Genitourinary:    General: Normal vulva.     Labia:        Right: No lesion.        Left: No lesion.      Vagina: Vaginal discharge present.     Cervix: No cervical motion tenderness or erythema.     Uterus: Not enlarged and not tender.      Adnexa:        Right: No mass or tenderness.         Left: No mass or tenderness.       Comments: Moderate amount dark brown discharge in vaginal vault.  Neurological:     General: No focal deficit present.     Mental Status: She is alert and oriented to person, place, and time.   Psychiatric:        Mood and Affect: Mood normal.        Behavior: Behavior normal.     Results: Results for orders placed or  performed in visit on 06/15/24 (from the past 24 hours)  POCT urine pregnancy     Status: None   Collection Time: 06/15/24 10:17 AM  Result Value Ref Range   Preg Test, Ur Negative Negative     Assessment/Plan: Vaginal discharge - Plan: Cervicovaginal ancillary only, POCT urine pregnancy, Urine Culture  Screen for sexually transmitted diseases - Plan: Cervicovaginal ancillary only  Abnormal menstruation - Plan: POCT urine pregnancy  Type 2 HSV infection of vulvovaginal region - Plan: valACYclovir  (VALTREX ) 1000 MG tablet  Await aptima results prior to treatment, discussed irregular cycle may be due to perimenopause or recent vacation (stress). Exam not consistent with PID and discussed that I do not recommend empiric treatment in absence of signs of STD prior to swab results. Valtrex  refilled.  Meds ordered this encounter  Medications   valACYclovir  (VALTREX ) 1000 MG tablet    Sig: Take 1 tablet (1,000 mg total) by mouth as directed. Take 1 tablet by mouth daily for 5 days. Take at first symptoms of new onset. No benefit to taking more than 5 days.    Dispense:  30 tablet    Refill:  4     Harlene LITTIE Cisco, CNM 06/15/2024 12:00 PM

## 2024-06-16 LAB — CERVICOVAGINAL ANCILLARY ONLY
Bacterial Vaginitis (gardnerella): NEGATIVE
Candida Glabrata: NEGATIVE
Candida Vaginitis: NEGATIVE
Chlamydia: NEGATIVE
Comment: NEGATIVE
Comment: NEGATIVE
Comment: NEGATIVE
Comment: NEGATIVE
Comment: NEGATIVE
Comment: NORMAL
Neisseria Gonorrhea: NEGATIVE
Trichomonas: NEGATIVE

## 2024-06-18 LAB — URINE CULTURE

## 2024-06-19 ENCOUNTER — Ambulatory Visit: Payer: Self-pay | Admitting: Certified Nurse Midwife

## 2024-06-20 ENCOUNTER — Ambulatory Visit: Payer: Self-pay | Admitting: Family Medicine

## 2024-06-23 ENCOUNTER — Other Ambulatory Visit: Payer: Self-pay | Admitting: Certified Nurse Midwife

## 2024-06-23 DIAGNOSIS — N921 Excessive and frequent menstruation with irregular cycle: Secondary | ICD-10-CM

## 2024-06-23 NOTE — Progress Notes (Signed)
 Ultrasound ordered given continued bleeding without explanation.

## 2024-07-14 ENCOUNTER — Telehealth: Payer: Self-pay

## 2024-07-14 NOTE — Telephone Encounter (Signed)
 Copied from CRM #[id number]. Topic: General - Call Back - No Documentation >> Jul 14, 2024 12:21 PM Wess RAMAN wrote: Reason for CRM: Patient was suppose to receive a callback last week from the nurse regarding her HPV vaccine and the timeframe between the two  Callback #: [phone]

## 2024-07-15 NOTE — Telephone Encounter (Signed)
 Sha and I looked at patient's health maintenance yesterday and I believe we saw that date in the tab. Please let me know if that isn't accurate and if I don't need to use that

## 2024-07-15 NOTE — Telephone Encounter (Signed)
 I don't see an HPV vaccine from 2021 in her chart, only 03/2024. If She has only had one vaccine in 4/25, then she needs to get 2 more vaccines.  She would get the second now and the third at least 12 weeks from the 2nd.  If we can see the first from 2021 in [place] and 4/25 was the second one, then she just needs the 3rd at least 12 weeks from the 4/25 dose.

## 2024-07-19 NOTE — Telephone Encounter (Signed)
 Have called to schedule PT for 2nd HPV

## 2024-07-28 ENCOUNTER — Other Ambulatory Visit: Payer: Self-pay | Admitting: Certified Nurse Midwife

## 2024-07-28 ENCOUNTER — Ambulatory Visit

## 2024-07-28 DIAGNOSIS — N921 Excessive and frequent menstruation with irregular cycle: Secondary | ICD-10-CM

## 2024-08-02 ENCOUNTER — Encounter: Payer: Self-pay | Admitting: Certified Nurse Midwife

## 2024-08-09 ENCOUNTER — Ambulatory Visit: Payer: Self-pay | Admitting: Certified Nurse Midwife

## 2024-08-09 ENCOUNTER — Ambulatory Visit: Admitting: Family Medicine

## 2024-08-09 ENCOUNTER — Other Ambulatory Visit

## 2024-09-02 ENCOUNTER — Ambulatory Visit: Admitting: Family Medicine

## 2024-09-12 NOTE — Progress Notes (Unsigned)
 ANNUAL EXAM Patient name: Brooke Koch MRN [id number]  Date of birth: [birth year] Chief Complaint:   No chief complaint on file.  History of Present Illness:   Brooke Koch is a 38 y.o. G20P1001 {race:25618} female being seen today for a routine annual exam.  Current complaints: ***  No LMP recorded.       The pregnancy intention screening data noted above was reviewed. Potential methods of contraception were discussed. The patient elected to proceed with No data recorded.      Component Value Date/Time   DIAGPAP  08/26/2023 0848    - Negative for intraepithelial lesion or malignancy (NILM)   DIAGPAP  08/03/2020 1009    - Negative for intraepithelial lesion or malignancy (NILM)   HPVHIGH Negative 08/26/2023 0848   HPVHIGH Negative 08/03/2020 1009   ADEQPAP  08/26/2023 0848    Satisfactory for evaluation; transformation zone component ABSENT.   ADEQPAP  08/03/2020 1009    Satisfactory for evaluation; transformation zone component PRESENT.      Last pap ***. Results were: {Pap findings:25134}. H/O abnormal pap: {yes/yes***/no:23866} Last mammogram: ***. Results were: {normal, abnormal, n/a:23837}. Family h/o breast cancer: {yes***/no:23838} Last colonoscopy: ***. Results were: {normal, abnormal, n/a:23837}. Family h/o colorectal cancer: {yes***/no:23838}     03/03/2024    8:31 AM 01/11/2024    1:04 PM 08/26/2023    8:14 AM 02/10/2023    8:59 AM 05/21/2022    8:12 AM  Depression screen PHQ 2/9  Decreased Interest 0 0 0 0 0  Down, Depressed, Hopeless 0 0 0 0 0  PHQ - 2 Score 0 0 0 0 0  Altered sleeping 0 1  0 0  Tired, decreased energy 0 0  0 1  Change in appetite 0 1  0 0  Feeling bad or failure about yourself  0 0  0 0  Trouble concentrating 0 0  0 0  Moving slowly or fidgety/restless 0 0  0 0  Suicidal thoughts 0 0  0 0  PHQ-9 Score 0 2  0 1  Difficult doing work/chores Not difficult at all Not difficult at all  Not difficult at all Not difficult at all         03/03/2024    8:32 AM 01/11/2024    1:04 PM  GAD 7 : Generalized Anxiety Score  Nervous, Anxious, on Edge 0 1  Control/stop worrying 0 1  Worry too much - different things 0 0  Trouble relaxing 0 1  Restless 0 0  Easily annoyed or irritable 0 0  Afraid - awful might happen 0 0  Total GAD 7 Score 0 3  Anxiety Difficulty Not difficult at all Not difficult at all      Past Medical History:  Diagnosis Date   Gastritis    History of Helicobacter pylori infection    HSV (herpes simplex virus) infection    Wears contact lenses     Family History  Problem Relation Age of Onset   Hypertension Mother    Glaucoma Mother    Hyperlipidemia Mother    Hypertension Father    Diabetes Father    Prostate cancer Father        cancer free x 1 year   Breast cancer Cousin    Breast cancer Cousin    Review of Systems:   Pertinent items are noted in HPI Denies any headaches, blurred vision, fatigue, shortness of breath, chest pain, abdominal pain, abnormal vaginal discharge/itching/odor/irritation, problems with periods, bowel movements, urination,  or intercourse unless otherwise stated above. Pertinent History Reviewed:  Reviewed past medical,surgical, social and family history.  Reviewed problem list, medications and allergies. Physical Assessment:  There were no vitals filed for this visit.There is no height or weight on file to calculate BMI.       Physical Exam   No results found for this or any previous visit (from the past 24 hours).  Assessment & Plan:  No problem-specific Assessment & Plan notes found for this encounter.    Mammogram: {Mammo f/u:25212::@ 40yo}, or sooner if problems Colonoscopy: {TCS f/u:25213::@ 40yo}, or sooner if problems  No orders of the defined types were placed in this encounter.   Meds: No orders of the defined types were placed in this encounter.   Follow-up: No follow-ups on file.  Mathis LITTIE Getting, CMA 09/12/2024 8:58 AM

## 2024-09-13 ENCOUNTER — Encounter: Payer: Self-pay | Admitting: Certified Nurse Midwife

## 2024-09-13 ENCOUNTER — Ambulatory Visit: Admitting: Certified Nurse Midwife

## 2024-09-13 VITALS — BP 122/82 | HR 69 | Ht 64.0 in | Wt 147.1 lb

## 2024-09-13 DIAGNOSIS — Z3169 Encounter for other general counseling and advice on procreation: Secondary | ICD-10-CM

## 2024-09-13 DIAGNOSIS — Z01419 Encounter for gynecological examination (general) (routine) without abnormal findings: Secondary | ICD-10-CM | POA: Diagnosis not present

## 2024-09-13 DIAGNOSIS — Z3009 Encounter for other general counseling and advice on contraception: Secondary | ICD-10-CM

## 2024-09-13 MED ORDER — VALACYCLOVIR HCL 1 G PO TABS: 1000.0000 mg | ORAL_TABLET | Freq: Every day | ORAL | 3 refills | Status: AC

## 2024-09-13 MED ORDER — NORETHINDRONE 0.35 MG PO TABS: 1.0000 | ORAL_TABLET | Freq: Every day | ORAL | 3 refills | Status: AC

## 2024-09-13 NOTE — Patient Instructions (Addendum)
 How to Do a Breast Self-Exam Doing breast self-exams can help you stay healthy. They're one way to know what's normal for your breasts. They can help you catch a problem while it's still small and can be treated. You need to: Check your breasts often. Tell your doctor about any changes. You should do breast self-exams even if you have breast implants. What you need: A mirror. A well-lit room. A pillow or other soft object. How to do a breast self-exam Look for changes  Take off all the clothes above your waist. Stand in front of a mirror in a room with good lighting. Put your hands down at your sides. Compare your breasts in the mirror. Look for difference between them, such as: Differences in shape. Differences in size. Wrinkles, dips, and bumps in one breast and not the other. Look at each breast for skin changes, such as: Redness. Scaly spots. Spots where your skin is thicker. Dimpling. Open sores. Look for changes in your nipples, such as: Fluid coming out of a nipple. Fluid around a nipple. Bleeding. Dimpling. Redness. A nipple that looks pushed in or that has changed position. Feel for changes Lie on your back. Feel each breast. To do this: Pick a breast to feel. Place a pillow under the shoulder closest to that breast. Put the arm closest to that breast behind your head. Feel the breast using the hand of your other arm. Use the pads of your three middle fingers to make small circles starting near the nipple. Use light, medium, and firm pressure. Keep making circles, moving down over the breast. Stop when you feel your ribs. Start making circles with your fingers again, this time going up until you reach your collarbone. Then, make circles out across your breast and into your armpit area. Squeeze your nipple. Check for fluid and lumps. Do these steps again to check your other breast. Sit or stand in the tub or shower. With soapy water on your skin, feel each breast  the same way you did when you were lying down. Write down what you find Writing down what you find can help you keep track of what you want to tell your doctor. Write down: What's normal for each breast. Any changes you find. Write down: The kind of change. If your breast feels tender or painful. Any lump you find. Write down its size and where it is. When you last had your period. General tips If you're breastfeeding, the best time to check your breasts is after you feed your baby or after you use a breast pump. If you get a period, the best time to check your breasts is 5-7 days after your period ends. With time, you'll get more used to doing the self-exam. You'll also start to know if there are changes in your breasts. Contact a doctor if: You see a change in the shape or size of your breasts or nipples. You see a change in the skin of your breast or nipples. You have fluid coming from your nipples that isn't normal. You find a new lump or thick area. You have breast pain. You have any concerns about your breast health. This information is not intended to replace advice given to you by your health care provider. Make sure you discuss any questions you have with your health care provider. Document Revised: 02/17/2024 Document Reviewed: 02/17/2024 Elsevier Patient Education  2025 [employer].  Preventive Care 40-58 Years Old, Female Preventive care refers to lifestyle choices  and visits with your health care provider that can promote health and wellness. Preventive care visits are also called wellness exams. What can I expect for my preventive care visit? Counseling Your health care provider may ask you questions about your: Medical history, including: Past medical problems. Family medical history. Pregnancy history. Current health, including: Menstrual cycle. Method of birth control. Emotional well-being. Home life and relationship well-being. Sexual activity and sexual  health. Lifestyle, including: Alcohol, nicotine or tobacco, and drug use. Access to firearms. Diet, exercise, and sleep habits. Work and work Astronomer. Sunscreen use. Safety issues such as seatbelt and bike helmet use. Physical exam Your health care provider will check your: Height and weight. These may be used to calculate your BMI (body mass index). BMI is a measurement that tells if you are at a healthy weight. Waist circumference. This measures the distance around your waistline. This measurement also tells if you are at a healthy weight and may help predict your risk of certain diseases, such as type 2 diabetes and high blood pressure. Heart rate and blood pressure. Body temperature. Skin for abnormal spots. What immunizations do I need?  Vaccines are usually given at various ages, according to a schedule. Your health care provider will recommend vaccines for you based on your age, medical history, and lifestyle or other factors, such as travel or where you work. What tests do I need? Screening Your health care provider may recommend screening tests for certain conditions. This may include: Lipid and cholesterol levels. Diabetes screening. This is done by checking your blood sugar (glucose) after you have not eaten for a while (fasting). Pelvic exam and Pap test. Hepatitis B test. Hepatitis C test. HIV (human immunodeficiency virus) test. STI (sexually transmitted infection) testing, if you are at risk. Lung cancer screening. Colorectal cancer screening. Mammogram. Talk with your health care provider about when you should start having regular mammograms. This may depend on whether you have a family history of breast cancer. BRCA-related cancer screening. This may be done if you have a family history of breast, ovarian, tubal, or peritoneal cancers. Bone density scan. This is done to screen for osteoporosis. Talk with your health care provider about your test results,  treatment options, and if necessary, the need for more tests. Follow these instructions at home: Eating and drinking  Eat a diet that includes fresh fruits and vegetables, whole grains, lean protein, and low-fat dairy products. Take vitamin and mineral supplements as recommended by your health care provider. Do not drink alcohol if: Your health care provider tells you not to drink. You are pregnant, may be pregnant, or are planning to become pregnant. If you drink alcohol: Limit how much you have to 0-1 drink a day. Know how much alcohol is in your drink. In the [place] one drink equals one 12 oz bottle of beer (355 mL), one 5 oz glass of wine (148 mL), or one 1 oz glass of hard liquor (44 mL). Lifestyle Brush your teeth every morning and night with fluoride toothpaste. Floss one time each day. Exercise for at least 30 minutes 5 or more days each week. Do not use any products that contain nicotine or tobacco. These products include cigarettes, chewing tobacco, and vaping devices, such as e-cigarettes. If you need help quitting, ask your health care provider. Do not use drugs. If you are sexually active, practice safe sex. Use a condom or other form of protection to prevent STIs. If you do not wish to become pregnant, use  a form of birth control. If you plan to become pregnant, see your health care provider for a prepregnancy visit. Take aspirin only as told by your health care provider. Make sure that you understand how much to take and what form to take. Work with your health care provider to find out whether it is safe and beneficial for you to take aspirin daily. Find healthy ways to manage stress, such as: Meditation, yoga, or listening to music. Journaling. Talking to a trusted person. Spending time with friends and family. Minimize exposure to UV radiation to reduce your risk of skin cancer. Safety Always wear your seat belt while driving or riding in a vehicle. Do not drive: If you  have been drinking alcohol. Do not ride with someone who has been drinking. When you are tired or distracted. While texting. If you have been using any mind-altering substances or drugs. Wear a helmet and other protective equipment during sports activities. If you have firearms in your house, make sure you follow all gun safety procedures. Seek help if you have been physically or sexually abused. What's next? Visit your health care provider once a year for an annual wellness visit. Ask your health care provider how often you should have your eyes and teeth checked. Stay up to date on all vaccines. This information is not intended to replace advice given to you by your health care provider. Make sure you discuss any questions you have with your health care provider. Document Revised: 06/05/2021 Document Reviewed: 06/05/2021 Elsevier Patient Education  2024 [employer].

## 2024-09-13 NOTE — Progress Notes (Deleted)
 ANNUAL EXAM Patient name: Brooke Koch MRN [id number]  Date of birth: [birth year] Chief Complaint:   Gynecologic Exam (Would like to discuss birth control)  History of Present Illness:   Brooke Koch is a 38 y.o. G14P1001 {race:25618} female being seen today for a routine annual exam.  Current complaints: ***  Patient's last menstrual period was 09/05/2024.   Period Cycle (Days): 28 Period Duration (Days): 5-7 Period Pattern: Regular Menstrual Flow: Moderate Menstrual Control: Thin pad, Maxi pad Menstrual Control Change Freq (Hours): 3-4 Dysmenorrhea: (!) Moderate Dysmenorrhea Symptoms: Cramping, Headache   Upstream - 09/13/24 9166      Pregnancy Intention Screening   Does the patient want to become pregnant in the next year? No    Does the patient's partner want to become pregnant in the next year? No    Would the patient like to discuss contraceptive options today? Yes      Contraception Wrap Up   Current Method Female Condom        The pregnancy intention screening data noted above was reviewed. Potential methods of contraception were discussed. The patient elected to proceed with No data recorded.      Component Value Date/Time   DIAGPAP  08/26/2023 0848    - Negative for intraepithelial lesion or malignancy (NILM)   DIAGPAP  08/03/2020 1009    - Negative for intraepithelial lesion or malignancy (NILM)   HPVHIGH Negative 08/26/2023 0848   HPVHIGH Negative 08/03/2020 1009   ADEQPAP  08/26/2023 0848    Satisfactory for evaluation; transformation zone component ABSENT.   ADEQPAP  08/03/2020 1009    Satisfactory for evaluation; transformation zone component PRESENT.      Last pap ***. Results were: {Pap findings:25134}. H/O abnormal pap: {yes/yes***/no:23866} Last mammogram: ***. Results were: {normal, abnormal, n/a:23837}. Family h/o breast cancer: {yes***/no:23838} Last colonoscopy: ***. Results were: {normal, abnormal, n/a:23837}. Family h/o colorectal  cancer: {yes***/no:23838}     09/13/2024    8:33 AM 03/03/2024    8:31 AM 01/11/2024    1:04 PM 08/26/2023    8:14 AM 02/10/2023    8:59 AM  Depression screen PHQ 2/9  Decreased Interest 0 0 0 0 0  Down, Depressed, Hopeless 0 0 0 0 0  PHQ - 2 Score 0 0 0 0 0  Altered sleeping  0 1  0  Tired, decreased energy  0 0  0  Change in appetite  0 1  0  Feeling bad or failure about yourself   0 0  0  Trouble concentrating  0 0  0  Moving slowly or fidgety/restless  0 0  0  Suicidal thoughts  0 0  0  PHQ-9 Score  0 2  0  Difficult doing work/chores  Not difficult at all Not difficult at all  Not difficult at all        03/03/2024    8:32 AM 01/11/2024    1:04 PM  GAD 7 : Generalized Anxiety Score  Nervous, Anxious, on Edge 0 1  Control/stop worrying 0 1  Worry too much - different things 0 0  Trouble relaxing 0 1  Restless 0 0  Easily annoyed or irritable 0 0  Afraid - awful might happen 0 0  Total GAD 7 Score 0 3  Anxiety Difficulty Not difficult at all Not difficult at all      Past Medical History:  Diagnosis Date  . Gastritis   . History of Helicobacter pylori infection   . HSV (herpes  simplex virus) infection   . Wears contact lenses     Family History  Problem Relation Age of Onset  . Hypertension Mother   . Glaucoma Mother   . Hyperlipidemia Mother   . Hypertension Father   . Diabetes Father   . Prostate cancer Father        cancer free x 1 year  . Breast cancer Cousin   . Breast cancer Cousin    Review of Systems:   Pertinent items are noted in HPI Denies any headaches, blurred vision, fatigue, shortness of breath, chest pain, abdominal pain, abnormal vaginal discharge/itching/odor/irritation, problems with periods, bowel movements, urination, or intercourse unless otherwise stated above. Pertinent History Reviewed:  Reviewed past medical,surgical, social and family history.  Reviewed problem list, medications and allergies. Physical Assessment:   Vitals:    09/13/24 0828  BP: 122/82  Pulse: 69  Weight: 147 lb 1.6 oz (66.7 kg)  Height: 5' 4 (1.626 m)  Body mass index is 25.25 kg/m.       Physical Exam   No results found for this or any previous visit (from the past 24 hours).  Assessment & Plan:  No problem-specific Assessment & Plan notes found for this encounter.    Mammogram: {Mammo f/u:25212}, or sooner if problems Colonoscopy: {TCS f/u:25213}, or sooner if problems  No orders of the defined types were placed in this encounter.   Meds: No orders of the defined types were placed in this encounter.   Follow-up: Return in 1 year (on 09/13/2025) for Annual exam.  Harlene LITTIE Cisco, CNM 09/13/2024 8:39 AM

## 2024-09-15 ENCOUNTER — Ambulatory Visit: Admitting: Family Medicine

## 2024-09-15 ENCOUNTER — Encounter: Payer: Self-pay | Admitting: Family Medicine

## 2024-09-15 VITALS — BP 122/80 | HR 74 | Ht 64.0 in | Wt 145.3 lb

## 2024-09-15 DIAGNOSIS — Z23 Encounter for immunization: Secondary | ICD-10-CM | POA: Diagnosis not present

## 2024-09-15 DIAGNOSIS — I10 Essential (primary) hypertension: Secondary | ICD-10-CM

## 2024-09-15 NOTE — Assessment & Plan Note (Signed)
 Hypertension is well-controlled with current medication regimen. Blood pressure readings are within target range. No reported issues with current medication, losartan -hydrochlorothiazide 50-12.5 mg daily. - Continue losartan -hydrochlorothiazide 50-12.5 mg daily. - Order kidney function and electrolyte tests.

## 2024-09-15 NOTE — Progress Notes (Signed)
 Established patient visit   Patient: Brooke Koch   DOB: 03-25-1986   38 y.o. Female  MRN: [id number] Visit Date: 09/15/2024  Today's healthcare provider: Jon Eva, MD   Chief Complaint  Patient presents with   Medical Management of Chronic Issues    Follow-up 6 month HTN and second HPV vaccine   Subjective    HPI HPI     Medical Management of Chronic Issues    Additional comments: Follow-up 6 month HTN and second HPV vaccine      Last edited by Rosas, Joseline E, CMA on 09/15/2024  9:33 AM.       Discussed the use of AI scribe software for clinical note transcription with the patient, who gave verbal consent to proceed.  History of Present Illness   Brooke Koch is a 38 year old female with hypertension who presents for a routine follow-up visit.  She is currently taking a combination pill of losartan  and hydrochlorothiazide at a dose of 50/12.5 mg daily for hypertension management. She is on progesterone-only birth control pills and is satisfied with this regimen. She received her second HPV vaccine today but did not receive a flu shot. She has no new concerns or questions at this time.         Medications: Outpatient Medications Prior to Visit  Medication Sig   losartan -hydrochlorothiazide (HYZAAR) 50-12.5 MG tablet Take 1 tablet by mouth daily.   norethindrone  (MICRONOR ) 0.35 MG tablet Take 1 tablet (0.35 mg total) by mouth daily.   valACYclovir  (VALTREX ) 1000 MG tablet Take 1 tablet (1,000 mg total) by mouth as directed. Take 1 tablet by mouth daily for 5 days. Take at first symptoms of new onset. No benefit to taking more than 5 days.   valACYclovir  (VALTREX ) 1000 MG tablet Take 1 tablet (1,000 mg total) by mouth daily.   No facility-administered medications prior to visit.    Review of Systems     Objective    BP 122/80 (BP Location: Left Arm, Patient Position: Sitting, Cuff Size: Normal)   Pulse 74   Ht 5' 4 (1.626 m)   Wt 145 lb  4.8 oz (65.9 kg)   LMP 09/05/2024   SpO2 100%   BMI 24.94 kg/m    Physical Exam Vitals reviewed.  Constitutional:      General: She is not in acute distress.    Appearance: Normal appearance. She is well-developed. She is not diaphoretic.  HENT:     Head: Normocephalic and atraumatic.  Eyes:     General: No scleral icterus.    Conjunctiva/sclera: Conjunctivae normal.  Neck:     Thyroid: No thyromegaly.  Cardiovascular:     Rate and Rhythm: Normal rate and regular rhythm.     Heart sounds: Normal heart sounds. No murmur heard. Pulmonary:     Effort: Pulmonary effort is normal. No respiratory distress.     Breath sounds: Normal breath sounds. No wheezing, rhonchi or rales.  Musculoskeletal:     Cervical back: Neck supple.     Right lower leg: No edema.     Left lower leg: No edema.  Lymphadenopathy:     Cervical: No cervical adenopathy.  Skin:    General: Skin is warm and dry.     Findings: No rash.  Neurological:     Mental Status: She is alert and oriented to person, place, and time. Mental status is at baseline.  Psychiatric:        Mood and Affect:  Mood normal.        Behavior: Behavior normal.      No results found for any visits on 09/15/24.  Assessment & Plan     Problem List Items Addressed This Visit       Cardiovascular and Mediastinum   Primary hypertension - Primary   Relevant Orders   Basic Metabolic Panel (BMET)   Other Visit Diagnoses       Immunization due       Relevant Orders   HPV 9-valent vaccine,Recombinat (Completed)           General Health Maintenance She is on progesterone-only birth control and is doing well. Discussed the option of switching to estrogen-containing birth control as blood pressure is controlled, but she is satisfied with current regimen. Declined flu shot today. Received second HPV vaccine; third dose is due in three months. Discussed aligning the third HPV vaccine with the physical due to training  commitments. - Administer third HPV vaccine in three months. - Align third HPV vaccine with physical if possible.       Return in about 6 months (around 03/15/2025) for CPE with HPV #3.       Jon Eva, MD  [facility] [phone] (phone) [phone] (fax)  [facility]

## 2024-09-16 ENCOUNTER — Ambulatory Visit: Payer: Self-pay | Admitting: Family Medicine

## 2024-09-16 LAB — BASIC METABOLIC PANEL WITH GFR
BUN/Creatinine Ratio: 20 (ref 9–23)
BUN: 13 mg/dL (ref 6–24)
CO2: 22 mmol/L (ref 20–29)
Calcium: 9.6 mg/dL (ref 8.7–10.2)
Chloride: 100 mmol/L (ref 96–106)
Creatinine, Ser: 0.64 mg/dL (ref 0.57–1.00)
Glucose: 69 mg/dL — ABNORMAL LOW (ref 70–99)
Potassium: 3.9 mmol/L (ref 3.5–5.2)
Sodium: 139 mmol/L (ref 134–144)
eGFR: 114 mL/min/1.73 (ref >59)

## 2024-09-29 ENCOUNTER — Ambulatory Visit
Admission: EM | Admit: 2024-09-29 | Discharge: 2024-09-29 | Disposition: A | Discharge status: Home / Self Care | Attending: Family Medicine | Admitting: Family Medicine

## 2024-09-29 DIAGNOSIS — J069 Acute upper respiratory infection, unspecified: Secondary | ICD-10-CM | POA: Insufficient documentation

## 2024-09-29 DIAGNOSIS — F411 Generalized anxiety disorder: Secondary | ICD-10-CM | POA: Diagnosis not present

## 2024-09-29 LAB — SARS CORONAVIRUS 2 BY RT PCR: SARS Coronavirus 2 by RT PCR: NEGATIVE

## 2024-09-29 LAB — GROUP A STREP BY PCR: Group A Strep by PCR: NOT DETECTED

## 2024-09-29 MED ORDER — LIDOCAINE VISCOUS HCL 2 % MT SOLN: 15.0000 mL | OROMUCOSAL | 0 refills | Status: AC | PRN

## 2024-09-29 NOTE — Discharge Instructions (Addendum)
 Your strep and COVID are negative. You have a viral respiratory infection that will gradually improve over the next 7-10 days. You can take Tylenol  and/or Ibuprofen as needed for fever reduction and pain relief.     For sore throat: try warm salt water  gargles, Mucinex sore throat cough drops or cepacol lozenges, throat spray, warm tea or water  with lemon/honey, popsicles or ice, or OTC cold relief medicine for throat discomfort. You can also purchase chloraseptic spray at the pharmacy or dollar store. Pick up the prescription throat pain relief solution.   It is important to stay hydrated: drink plenty of fluids (water , gatorade/powerade/pedialyte, juices, or teas) to keep your throat moisturized and help further relieve irritation/discomfort.    Return or go to the Emergency Department if symptoms worsen or do not improve in the next few days

## 2024-09-29 NOTE — ED Provider Notes (Signed)
 [facility]    CSN: [id number] Arrival date & time: 09/29/24  0855      History   Chief Complaint Chief Complaint  Patient presents with   Sore Throat    HPI Brooke Koch is a 38 y.o. female.   HPI  History obtained from the patient. Brooke Koch presents for sore throat and right sided neck pain that started yesterday.  She has had painful swallowing.  She has been taking Tylenol  for her symptoms with mild relief.  Denies cough, nasal congestion, rhinorrhea, nausea, vomiting, diarrhea, rash or abdominal pain.  No known sick contacts.        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/07/2024   History of migraine 02/06/2022   Chronic migraine without aura without status migrainosus, not intractable 02/06/2022   Menorrhagia with irregular cycle 02/06/2022   Elevated LDL cholesterol level 02/06/2022   Primary hypertension 07/23/2021   History of recurrent UTIs 06/16/2017    Past Surgical History:  Procedure Laterality Date   COLONOSCOPY WITH PROPOFOL  N/A 10/29/2020   Procedure: COLONOSCOPY WITH BIOPSY;  Surgeon: Jinny Carmine, MD;  Location: [facility];  Service: Endoscopy;  Laterality: N/A;   ESOPHAGOGASTRODUODENOSCOPY  12/2009    OB History     Gravida  1   Para  1   Term  1   Preterm      AB      Living  1      SAB      IAB      Ectopic      Multiple      Live Births  1            Home Medications    Prior to Admission medications   Medication Sig Start Date End Date Taking? Authorizing Provider  lidocaine  (XYLOCAINE ) 2 % solution Use as directed 15 mLs in the mouth or throat as needed. 09/29/24  Yes Kaityln Kallstrom, DO  losartan -hydrochlorothiazide (HYZAAR) 50-12.5 MG  tablet Take 1 tablet by mouth daily. 03/03/24  Yes Bacigalupo, Jon HERO, MD  norethindrone  (MICRONOR ) 0.35 MG tablet Take 1 tablet (0.35 mg total) by mouth daily. 09/13/24   Jayne Harlene CROME, CNM  valACYclovir  (VALTREX ) 1000 MG tablet Take 1 tablet (1,000 mg total) by mouth as directed. Take 1 tablet by mouth daily for 5 days. Take at first symptoms of new onset. No benefit to taking more than 5 days. 06/15/24   Jayne Harlene CROME, CNM  valACYclovir  (VALTREX ) 1000 MG tablet Take 1 tablet (1,000 mg total) by mouth daily. 09/13/24   Jayne Harlene CROME, CNM    Family History Family History  Problem Relation Age of Onset   Hypertension Mother    Glaucoma Mother    Hyperlipidemia Mother    Hypertension Father    Diabetes Father    Prostate cancer Father        cancer free x 1 year   Breast cancer Cousin    Breast cancer Cousin    Heart disease Maternal Grandmother    Heart disease Paternal Grandmother     Social History Social History   Tobacco Use   Smoking status: Never   Smokeless tobacco: Never  Vaping Use  Vaping status: Never Used  Substance Use Topics   Alcohol use: Yes    Alcohol/week: 0.0 standard drinks of alcohol    Comment: social drinker   Drug use: No     Allergies   Septra [sulfamethoxazole-trimethoprim]   Review of Systems Review of Systems: negative unless otherwise stated in HPI.      Physical Exam Triage Vital Signs ED Triage Vitals  Encounter Vitals Group     BP 09/29/24 0938 127/85     Girls Systolic BP Percentile --      Girls Diastolic BP Percentile --      Boys Systolic BP Percentile --      Boys Diastolic BP Percentile --      Pulse Rate 09/29/24 0938 81     Resp 09/29/24 0938 16     Temp 09/29/24 0938 98.7 F (37.1 C)     Temp Source 09/29/24 0938 Oral     SpO2 09/29/24 0938 96 %     Weight --      Height --      Head Circumference --      Peak Flow --      Pain Score 09/29/24 0939 9     Pain Loc --      Pain Education --       Exclude from Growth Chart --    No data found.  Updated Vital Signs BP 127/85 (BP Location: Right Arm)   Pulse 81   Temp 98.7 F (37.1 C) (Oral)   Resp 16   LMP 09/28/2024 (Exact Date)   SpO2 96%   Visual Acuity Right Eye Distance:   Left Eye Distance:   Bilateral Distance:    Right Eye Near:   Left Eye Near:    Bilateral Near:     Physical Exam GEN:     alert, non-toxic appearing female in no distress    HENT:  mucus membranes moist, oropharyngeal without lesions, moderate erythema, no tonsillar hypertrophy or exudates, no nasal discharge EYES:   pupils equal and reactive, no scleral injection or discharge NECK:  normal ROM, +lymphadenopathy, no meningismus   RESP:  no increased work of breathing CVS:   regular rate Skin:   warm and dry, no rash on visible skin    UC Treatments / Results  Labs (all labs ordered are listed, but only abnormal results are displayed) Labs Reviewed  GROUP A STREP BY PCR  SARS CORONAVIRUS 2 BY RT PCR    EKG   Radiology No results found.   Procedures Procedures (including critical care time)  Medications Ordered in UC Medications - No data to display  Initial Impression / Assessment and Plan / UC Course  I have reviewed the triage vital signs and the nursing notes.  Pertinent labs & imaging results that were available during my care of the patient were reviewed by me and considered in my medical decision making (see chart for details).       Strep pharyngitis Patient is a 38 y.o. female who presents for sore throat for the past day.  Overall patient is non-toxic-appearing, well-hydrated and without respiratory distress. Pt is afebrile here.  On exam, pharyngeal exam is erythematous but no exudates. Or tonsillar hypertrophy. Strep PRC test is negative. COVID PCR is negative.   Tylenol /Motrin as needed for discomfort.  Recommended gargling with warm salt water  and avoiding anything that irritates her throat.  Treat throat pain  with lidocaine  solution.   Stressed the importance of hydration.  Work note provided, as needed.   Discussed MDM, treatment plan and plan for follow-up with patient who agrees with plan.     Final Clinical Impressions(s) / UC Diagnoses   Final diagnoses:  Viral upper respiratory infection     Discharge Instructions      Your strep and COVID are negative. You have a viral respiratory infection that will gradually improve over the next 7-10 days. You can take Tylenol  and/or Ibuprofen as needed for fever reduction and pain relief.     For sore throat: try warm salt water  gargles, Mucinex sore throat cough drops or cepacol lozenges, throat spray, warm tea or water  with lemon/honey, popsicles or ice, or OTC cold relief medicine for throat discomfort. You can also purchase chloraseptic spray at the pharmacy or dollar store. Pick up the prescription throat pain relief solution.   It is important to stay hydrated: drink plenty of fluids (water , gatorade/powerade/pedialyte, juices, or teas) to keep your throat moisturized and help further relieve irritation/discomfort.    Return or go to the Emergency Department if symptoms worsen or do not improve in the next few days      ED Prescriptions     Medication Sig Dispense Auth. Provider   lidocaine  (XYLOCAINE ) 2 % solution Use as directed 15 mLs in the mouth or throat as needed. 100 mL Tyler Cubit, DO      PDMP not reviewed this encounter.   Charon Smedberg, DO 09/29/24 1039

## 2024-09-29 NOTE — ED Triage Notes (Addendum)
 Sore throat, pt states its only on the right side and the pain goes to her ear x 1 day. Taking ibuprofen.   Pt would like strep and coivd testing.

## 2024-09-30 ENCOUNTER — Ambulatory Visit

## 2024-10-02 ENCOUNTER — Emergency Department
Admission: EM | Admit: 2024-10-02 | Discharge: 2024-10-02 | Disposition: A | Discharge status: Home / Self Care | Attending: Emergency Medicine | Admitting: Emergency Medicine

## 2024-10-02 ENCOUNTER — Other Ambulatory Visit: Payer: Self-pay

## 2024-10-02 DIAGNOSIS — J029 Acute pharyngitis, unspecified: Secondary | ICD-10-CM | POA: Diagnosis not present

## 2024-10-02 LAB — RESP PANEL BY RT-PCR (RSV, FLU A&B, COVID)  RVPGX2
Influenza A by PCR: NEGATIVE
Influenza B by PCR: NEGATIVE
Resp Syncytial Virus by PCR: NEGATIVE
SARS Coronavirus 2 by RT PCR: NEGATIVE

## 2024-10-02 LAB — GROUP A STREP BY PCR: Group A Strep by PCR: NOT DETECTED

## 2024-10-02 MED ORDER — AMOXICILLIN 500 MG PO TABS: 500.0000 mg | ORAL_TABLET | Freq: Two times a day (BID) | ORAL | 0 refills | Status: AC

## 2024-10-02 NOTE — ED Triage Notes (Signed)
 Pt c/o of throat pain since Wednesday. States seen at [facility] on Thursday & diagnosed with upper respiratory infection. Feels worse today. Per pt, she's taking tylenol  & ibuprofen for pain with no relief.

## 2024-10-02 NOTE — Discharge Instructions (Addendum)
 Please get plenty of fluids.  Take antibiotics as directed.  Please take Tylenol  650 mg up to 4 times daily for pain.  Follow-up with primary care for further evaluation.  Return here for new or worse symptoms including severe fever, worsening pain, difficulty breathing or swallowing.

## 2024-10-02 NOTE — ED Provider Notes (Signed)
 [place] EMERGENCY DEPARTMENT AT [facility] Provider Note   CSN: [id number] Arrival date & time: 10/02/24  9062     Patient presents with: Sore Throat   Brooke Koch is a 38 y.o. female.   Patient here for sore throat.  Noting 4 days now of sore throat painful swallowing.  Denying any difficulty breathing.  Still keeping down medicine but not wanting to eat as much as normal.  Was seen a few days ago at urgent care with negative COVID and strep throat swabs.  Denying any chest pain cough congestion.   Sore Throat Pertinent negatives include no chest pain, no abdominal pain and no shortness of breath.       Prior to Admission medications   Medication Sig Start Date End Date Taking? Authorizing Provider  amoxicillin (AMOXIL) 500 MG tablet Take 1 tablet (500 mg total) by mouth 2 (two) times daily for 10 days. 10/02/24 10/12/24 Yes Adelfo Diebel, Alyce, PA-C  lidocaine  (XYLOCAINE ) 2 % solution Use as directed 15 mLs in the mouth or throat as needed. 09/29/24   Brimage, Vondra, DO  losartan -hydrochlorothiazide (HYZAAR) 50-12.5 MG tablet Take 1 tablet by mouth daily. 03/03/24   Bacigalupo, Angela M, MD  norethindrone  (MICRONOR ) 0.35 MG tablet Take 1 tablet (0.35 mg total) by mouth daily. 09/13/24   Jayne Harlene CROME, CNM  valACYclovir  (VALTREX ) 1000 MG tablet Take 1 tablet (1,000 mg total) by mouth as directed. Take 1 tablet by mouth daily for 5 days. Take at first symptoms of new onset. No benefit to taking more than 5 days. 06/15/24   Jayne Harlene CROME, CNM  valACYclovir  (VALTREX ) 1000 MG tablet Take 1 tablet (1,000 mg total) by mouth daily. 09/13/24   Jayne Harlene CROME, CNM    Allergies: Septra [sulfamethoxazole-trimethoprim]    Review of Systems  Constitutional:  Negative for chills and fever.  HENT:  Positive for ear pain, sore throat and trouble swallowing. Negative for congestion.   Respiratory:  Negative for cough and shortness of breath.   Cardiovascular:  Negative  for chest pain.  Gastrointestinal:  Negative for abdominal pain, diarrhea, nausea and vomiting.  Genitourinary:  Negative for dysuria.  Neurological:  Negative for weakness and numbness.    Updated Vital Signs BP (!) 139/97 (BP Location: Left Arm)   Pulse 100   Temp 98.4 F (36.9 C) (Oral)   Resp 20   Ht 5' 4 (1.626 m)   Wt 65.9 kg   LMP 09/28/2024 (Exact Date)   SpO2 96%   BMI 24.94 kg/m   Physical Exam Vitals reviewed.  Constitutional:      Appearance: Normal appearance.  HENT:     Head: Normocephalic and atraumatic.     Right Ear: No tenderness. No middle ear effusion. Tympanic membrane is not erythematous.     Left Ear: No tenderness.  No middle ear effusion. Tympanic membrane is not erythematous.     Nose: Nose normal.     Mouth/Throat:     Comments: Moderate right sided tonsillar erythema and mild edema with some scattered exudate.  No oral obstruction.  OP otherwise clear.  Mild right sided tonsillar lymphadenopathy without any submandibular swelling. Cardiovascular:     Pulses: Normal pulses.  Pulmonary:     Effort: Pulmonary effort is normal.  Musculoskeletal:     Cervical back: Normal range of motion.  Neurological:     Mental Status: She is alert and oriented to person, place, and time. Mental status is at baseline.  Psychiatric:  Mood and Affect: Mood normal.        Behavior: Behavior normal.     (all labs ordered are listed, but only abnormal results are displayed) Labs Reviewed  GROUP A STREP BY PCR  RESP PANEL BY RT-PCR (RSV, FLU A&B, COVID)  [id number]  CULTURE, GROUP A STREP [facility])    EKG: None  Radiology: No results found.   Procedures   Medications Ordered in the ED - No data to display                                  Medical Decision Making Patient here for sore throat she is afebrile without significant tachycardia hemodynamically stable.  Airway is clear nonobstructive and she is overall well-appearing.  She does have  evidence of likely pharyngitis.  Will await swabs.  Negative strep, COVID, flu, RSV.  Will start on antibiotics.  No recent antibiotic use.  Will also send off a throat culture.  Recommend symptomatic treatment.  Follow-up with primary.  Given return precautions here.  Risk Prescription drug management.        Final diagnoses:  Acute pharyngitis, unspecified etiology    ED Discharge Orders          Ordered    amoxicillin (AMOXIL) 500 MG tablet  2 times daily        10/02/24 1009               [place]ston Mallick, [place] 10/02/24 1035    Bradler, Evan K, MD 10/02/24 [phone]

## 2024-10-05 LAB — CULTURE, GROUP A STREP (THRC)

## 2024-10-05 NOTE — Progress Notes (Signed)
 This encounter was created in error - please disregard.

## 2024-11-10 DIAGNOSIS — F411 Generalized anxiety disorder: Secondary | ICD-10-CM | POA: Diagnosis not present

## 2025-03-09 ENCOUNTER — Encounter: Admitting: Family Medicine

## 2025-03-14 ENCOUNTER — Encounter: Admitting: Family Medicine

## 2025-03-16 ENCOUNTER — Encounter: Admitting: Family Medicine

## 2025-03-20 ENCOUNTER — Encounter: Admitting: Family Medicine
